# Patient Record
Sex: Male | Born: 1975 | Race: White | Hispanic: No | Marital: Married | Smoking: Current every day smoker
Health system: Southern US, Community
[De-identification: ages and names within clinical notes are randomized; demographics above are authoritative.]

## PROBLEM LIST (undated history)

## (undated) DIAGNOSIS — J9383 Other pneumothorax: Secondary | ICD-10-CM

## (undated) DIAGNOSIS — F419 Anxiety disorder, unspecified: Secondary | ICD-10-CM

## (undated) HISTORY — DX: Anxiety disorder, unspecified: F41.9

## (undated) HISTORY — PX: OTHER SURGICAL HISTORY: SHX169

---

## 1998-09-02 HISTORY — PX: OTHER SURGICAL HISTORY: SHX169

## 2003-03-18 ENCOUNTER — Emergency Department (HOSPITAL_COMMUNITY): Admission: EM | Admit: 2003-03-18 | Discharge: 2003-03-18 | Payer: Self-pay

## 2003-06-10 ENCOUNTER — Emergency Department (HOSPITAL_COMMUNITY): Admission: EM | Admit: 2003-06-10 | Discharge: 2003-06-10 | Payer: Self-pay | Admitting: Emergency Medicine

## 2004-03-11 ENCOUNTER — Emergency Department (HOSPITAL_COMMUNITY): Admission: EM | Admit: 2004-03-11 | Discharge: 2004-03-11 | Payer: Self-pay | Admitting: Emergency Medicine

## 2004-10-21 ENCOUNTER — Emergency Department (HOSPITAL_COMMUNITY): Admission: EM | Admit: 2004-10-21 | Discharge: 2004-10-21 | Payer: Self-pay | Admitting: Emergency Medicine

## 2005-03-25 ENCOUNTER — Emergency Department (HOSPITAL_COMMUNITY): Admission: EM | Admit: 2005-03-25 | Discharge: 2005-03-25 | Payer: Self-pay | Admitting: Emergency Medicine

## 2005-05-09 ENCOUNTER — Emergency Department (HOSPITAL_COMMUNITY): Admission: EM | Admit: 2005-05-09 | Discharge: 2005-05-09 | Payer: Self-pay | Admitting: Emergency Medicine

## 2005-06-03 ENCOUNTER — Emergency Department (HOSPITAL_COMMUNITY): Admission: EM | Admit: 2005-06-03 | Discharge: 2005-06-03 | Payer: Self-pay | Admitting: Emergency Medicine

## 2005-06-11 ENCOUNTER — Emergency Department (HOSPITAL_COMMUNITY): Admission: EM | Admit: 2005-06-11 | Discharge: 2005-06-11 | Payer: Self-pay | Admitting: Emergency Medicine

## 2005-09-09 ENCOUNTER — Emergency Department (HOSPITAL_COMMUNITY): Admission: EM | Admit: 2005-09-09 | Discharge: 2005-09-09 | Payer: Self-pay | Admitting: Emergency Medicine

## 2006-01-11 ENCOUNTER — Emergency Department (HOSPITAL_COMMUNITY): Admission: EM | Admit: 2006-01-11 | Discharge: 2006-01-11 | Payer: Self-pay | Admitting: Emergency Medicine

## 2006-05-26 ENCOUNTER — Emergency Department (HOSPITAL_COMMUNITY): Admission: EM | Admit: 2006-05-26 | Discharge: 2006-05-26 | Payer: Self-pay | Admitting: Emergency Medicine

## 2006-06-24 ENCOUNTER — Emergency Department (HOSPITAL_COMMUNITY): Admission: EM | Admit: 2006-06-24 | Discharge: 2006-06-24 | Payer: Self-pay | Admitting: Emergency Medicine

## 2006-08-11 ENCOUNTER — Emergency Department (HOSPITAL_COMMUNITY): Admission: EM | Admit: 2006-08-11 | Discharge: 2006-08-11 | Payer: Self-pay | Admitting: Emergency Medicine

## 2006-12-18 ENCOUNTER — Emergency Department (HOSPITAL_COMMUNITY): Admission: EM | Admit: 2006-12-18 | Discharge: 2006-12-18 | Payer: Self-pay | Admitting: Emergency Medicine

## 2008-04-03 ENCOUNTER — Emergency Department (HOSPITAL_COMMUNITY): Admission: EM | Admit: 2008-04-03 | Discharge: 2008-04-03 | Payer: Self-pay | Admitting: Emergency Medicine

## 2011-12-23 ENCOUNTER — Inpatient Hospital Stay (HOSPITAL_COMMUNITY)
Admission: EM | Admit: 2011-12-23 | Discharge: 2011-12-29 | DRG: 575 | Disposition: A | Discharge status: Home Health | Payer: Medicaid Other | Attending: Internal Medicine | Admitting: Internal Medicine

## 2011-12-23 ENCOUNTER — Emergency Department (HOSPITAL_COMMUNITY): Payer: Medicaid Other

## 2011-12-23 ENCOUNTER — Inpatient Hospital Stay (HOSPITAL_COMMUNITY): Payer: Medicaid Other | Admitting: *Deleted

## 2011-12-23 ENCOUNTER — Encounter (HOSPITAL_COMMUNITY): Payer: Self-pay | Admitting: *Deleted

## 2011-12-23 ENCOUNTER — Encounter (HOSPITAL_COMMUNITY)
Admission: EM | Disposition: A | Discharge status: Home Health | Payer: Self-pay | Source: Home / Self Care | Attending: Internal Medicine

## 2011-12-23 DIAGNOSIS — E860 Dehydration: Secondary | ICD-10-CM | POA: Diagnosis present

## 2011-12-23 DIAGNOSIS — D72829 Elevated white blood cell count, unspecified: Secondary | ICD-10-CM | POA: Diagnosis present

## 2011-12-23 DIAGNOSIS — B954 Other streptococcus as the cause of diseases classified elsewhere: Secondary | ICD-10-CM | POA: Diagnosis present

## 2011-12-23 DIAGNOSIS — F329 Major depressive disorder, single episode, unspecified: Secondary | ICD-10-CM | POA: Diagnosis present

## 2011-12-23 DIAGNOSIS — Z79899 Other long term (current) drug therapy: Secondary | ICD-10-CM

## 2011-12-23 DIAGNOSIS — F3289 Other specified depressive episodes: Secondary | ICD-10-CM | POA: Diagnosis present

## 2011-12-23 DIAGNOSIS — F17200 Nicotine dependence, unspecified, uncomplicated: Secondary | ICD-10-CM | POA: Diagnosis present

## 2011-12-23 DIAGNOSIS — L0390 Cellulitis, unspecified: Secondary | ICD-10-CM

## 2011-12-23 DIAGNOSIS — F32A Depression, unspecified: Secondary | ICD-10-CM

## 2011-12-23 DIAGNOSIS — L02519 Cutaneous abscess of unspecified hand: Principal | ICD-10-CM | POA: Diagnosis present

## 2011-12-23 DIAGNOSIS — E876 Hypokalemia: Secondary | ICD-10-CM

## 2011-12-23 DIAGNOSIS — L0291 Cutaneous abscess, unspecified: Secondary | ICD-10-CM

## 2011-12-23 HISTORY — PX: I&D EXTREMITY: SHX5045

## 2011-12-23 HISTORY — DX: Other pneumothorax: J93.83

## 2011-12-23 LAB — COMPREHENSIVE METABOLIC PANEL
ALT: 31 U/L (ref 0–53)
AST: 18 U/L (ref 0–37)
Albumin: 3 g/dL — ABNORMAL LOW (ref 3.5–5.2)
Alkaline Phosphatase: 104 U/L (ref 39–117)
Calcium: 9.3 mg/dL (ref 8.4–10.5)
Chloride: 93 mEq/L — ABNORMAL LOW (ref 96–112)
Creatinine, Ser: 0.76 mg/dL (ref 0.50–1.35)
GFR calc non Af Amer: >90 mL/min (ref >90)
Glucose, Bld: 108 mg/dL — ABNORMAL HIGH (ref 70–99)
Potassium: 2.9 mEq/L — ABNORMAL LOW (ref 3.5–5.1)
Sodium: 132 mEq/L — ABNORMAL LOW (ref 135–145)
Total Bilirubin: 0.4 mg/dL (ref 0.3–1.2)
Total Protein: 7 g/dL (ref 6.0–8.3)

## 2011-12-23 LAB — DIFFERENTIAL
Basophils Absolute: 0 10*3/uL (ref 0.0–0.1)
Basophils Relative: 0 % (ref 0–1)
Eosinophils Absolute: 0 10*3/uL (ref 0.0–0.7)
Eosinophils Relative: 0 % (ref 0–5)
Lymphocytes Relative: 8 % — ABNORMAL LOW (ref 12–46)
Lymphs Abs: 1.8 10*3/uL (ref 0.7–4.0)
Monocytes Absolute: 2 10*3/uL — ABNORMAL HIGH (ref 0.1–1.0)
Monocytes Relative: 9 % (ref 3–12)

## 2011-12-23 LAB — GRAM STAIN

## 2011-12-23 LAB — CBC
MCH: 31.3 pg (ref 26.0–34.0)
MCHC: 36.4 g/dL — ABNORMAL HIGH (ref 30.0–36.0)
MCV: 85.9 fL (ref 78.0–100.0)
Platelets: 211 10*3/uL (ref 150–400)
RBC: 4.67 MIL/uL (ref 4.22–5.81)
RDW: 14.2 % (ref 11.5–15.5)
WBC: 21.7 10*3/uL — ABNORMAL HIGH (ref 4.0–10.5)

## 2011-12-23 LAB — LACTIC ACID, PLASMA: Lactic Acid, Venous: 1.3 mmol/L (ref 0.5–2.2)

## 2011-12-23 SURGERY — IRRIGATION AND DEBRIDEMENT EXTREMITY
Anesthesia: General | Site: Hand | Laterality: Right | Wound class: Dirty or Infected

## 2011-12-23 MED ORDER — MIDAZOLAM HCL 5 MG/5ML IJ SOLN
INTRAMUSCULAR | Status: DC | PRN
  Administered 2011-12-23: 2 mg via INTRAVENOUS

## 2011-12-23 MED ORDER — BUPIVACAINE HCL (PF) 0.25 % IJ SOLN
INTRAMUSCULAR | Status: DC | PRN
  Administered 2011-12-23: 10 mL

## 2011-12-23 MED ORDER — ONDANSETRON HCL 4 MG/2ML IJ SOLN
INTRAMUSCULAR | Status: DC | PRN
  Administered 2011-12-23: 4 mg via INTRAVENOUS

## 2011-12-23 MED ORDER — HYDROMORPHONE HCL PF 1 MG/ML IJ SOLN
0.2500 mg | INTRAMUSCULAR | Status: DC | PRN
  Administered 2011-12-23 – 2011-12-24 (×5): 0.5 mg via INTRAVENOUS
  Filled 2011-12-23: qty 1

## 2011-12-23 MED ORDER — VANCOMYCIN HCL IN DEXTROSE 1-5 GM/200ML-% IV SOLN
1000.0000 mg | Freq: Once | INTRAVENOUS | Status: AC
  Administered 2011-12-23: 1000 mg via INTRAVENOUS
  Filled 2011-12-23: qty 200

## 2011-12-23 MED ORDER — FENTANYL CITRATE 0.05 MG/ML IJ SOLN
INTRAMUSCULAR | Status: DC | PRN
  Administered 2011-12-23: 100 ug via INTRAVENOUS
  Administered 2011-12-23: 150 ug via INTRAVENOUS
  Administered 2011-12-23: 100 ug via INTRAVENOUS

## 2011-12-23 MED ORDER — HYDROMORPHONE HCL PF 1 MG/ML IJ SOLN
1.0000 mg | Freq: Once | INTRAMUSCULAR | Status: AC
  Administered 2011-12-23: 1 mg via INTRAVENOUS
  Filled 2011-12-23: qty 1

## 2011-12-23 MED ORDER — LACTATED RINGERS IV SOLN
INTRAVENOUS | Status: DC | PRN
  Administered 2011-12-23: 22:00:00 via INTRAVENOUS

## 2011-12-23 MED ORDER — SODIUM CHLORIDE 0.9 % IR SOLN
Status: DC | PRN
  Administered 2011-12-23: 3000 mL

## 2011-12-23 MED ORDER — HYDROMORPHONE HCL PF 1 MG/ML IJ SOLN
INTRAMUSCULAR | Status: AC
  Administered 2011-12-23: 0.5 mg via INTRAVENOUS
  Filled 2011-12-23: qty 1

## 2011-12-23 MED ORDER — MORPHINE SULFATE 2 MG/ML IJ SOLN: 0.0500 mg/kg | INTRAMUSCULAR | Status: DC | PRN

## 2011-12-23 MED ORDER — PROPOFOL 10 MG/ML IV BOLUS
INTRAVENOUS | Status: DC | PRN
  Administered 2011-12-23: 200 mg via INTRAVENOUS

## 2011-12-23 MED ORDER — ONDANSETRON HCL 4 MG/2ML IJ SOLN
4.0000 mg | Freq: Once | INTRAMUSCULAR | Status: AC
  Administered 2011-12-23: 4 mg via INTRAVENOUS
  Filled 2011-12-23: qty 2

## 2011-12-23 MED ORDER — CLINDAMYCIN PHOSPHATE 600 MG/50ML IV SOLN
600.0000 mg | Freq: Once | INTRAVENOUS | Status: AC
  Administered 2011-12-23: 600 mg via INTRAVENOUS
  Filled 2011-12-23: qty 50

## 2011-12-23 MED ORDER — ONDANSETRON HCL 4 MG/2ML IJ SOLN: 4.0000 mg | Freq: Once | INTRAMUSCULAR | Status: AC | PRN

## 2011-12-23 MED ORDER — SUCCINYLCHOLINE CHLORIDE 20 MG/ML IJ SOLN
INTRAMUSCULAR | Status: DC | PRN
  Administered 2011-12-23: 120 mg via INTRAVENOUS

## 2011-12-23 SURGICAL SUPPLY — 49 items
BANDAGE CONFORM 2  STR LF (GAUZE/BANDAGES/DRESSINGS) IMPLANT
BANDAGE ELASTIC 3 VELCRO ST LF (GAUZE/BANDAGES/DRESSINGS) IMPLANT
BANDAGE ELASTIC 4 VELCRO ST LF (GAUZE/BANDAGES/DRESSINGS) ×2 IMPLANT
BANDAGE GAUZE ELAST BULKY 4 IN (GAUZE/BANDAGES/DRESSINGS) ×2 IMPLANT
BNDG CMPR 9X4 STRL LF SNTH (GAUZE/BANDAGES/DRESSINGS) ×1
BNDG ESMARK 4X9 LF (GAUZE/BANDAGES/DRESSINGS) ×2 IMPLANT
CLOTH BEACON ORANGE TIMEOUT ST (SAFETY) ×2 IMPLANT
CORDS BIPOLAR (ELECTRODE) ×2 IMPLANT
COVER SURGICAL LIGHT HANDLE (MISCELLANEOUS) ×2 IMPLANT
CUFF TOURNIQUET SINGLE 18IN (TOURNIQUET CUFF) ×2 IMPLANT
DECANTER SPIKE VIAL GLASS SM (MISCELLANEOUS) IMPLANT
DRAIN PENROSE 1/4X12 LTX STRL (WOUND CARE) IMPLANT
DRAPE OEC MINIVIEW 54X84 (DRAPES) IMPLANT
DRAPE SURG 17X23 STRL (DRAPES) ×2 IMPLANT
DRSG PAD ABDOMINAL 8X10 ST (GAUZE/BANDAGES/DRESSINGS) ×4 IMPLANT
DURAPREP 26ML APPLICATOR (WOUND CARE) ×2 IMPLANT
ELECT REM PT RETURN 9FT ADLT (ELECTROSURGICAL)
ELECTRODE REM PT RTRN 9FT ADLT (ELECTROSURGICAL) IMPLANT
GAUZE PACKING IODOFORM 1/4X5 (PACKING) ×2 IMPLANT
GAUZE XEROFORM 1X8 LF (GAUZE/BANDAGES/DRESSINGS) IMPLANT
GLOVE BIO SURGEON STRL SZ8.5 (GLOVE) ×2 IMPLANT
GOWN SRG XL XLNG 56XLVL 4 (GOWN DISPOSABLE) IMPLANT
GOWN STRL NON-REIN LRG LVL3 (GOWN DISPOSABLE) ×4 IMPLANT
GOWN STRL NON-REIN XL XLG LVL4 (GOWN DISPOSABLE)
HANDPIECE INTERPULSE COAX TIP (DISPOSABLE) ×2
KIT BASIN OR (CUSTOM PROCEDURE TRAY) ×2 IMPLANT
KIT ROOM TURNOVER OR (KITS) ×2 IMPLANT
MANIFOLD NEPTUNE II (INSTRUMENTS) ×2 IMPLANT
NEEDLE HYPO 25GX1X1/2 BEV (NEEDLE) ×2 IMPLANT
NEEDLE HYPO 25X1 1.5 SAFETY (NEEDLE) ×2 IMPLANT
NS IRRIG 1000ML POUR BTL (IV SOLUTION) IMPLANT
PACK ORTHO EXTREMITY (CUSTOM PROCEDURE TRAY) ×2 IMPLANT
PAD ARMBOARD 7.5X6 YLW CONV (MISCELLANEOUS) ×2 IMPLANT
PAD CAST 4YDX4 CTTN HI CHSV (CAST SUPPLIES) ×1 IMPLANT
PADDING CAST COTTON 4X4 STRL (CAST SUPPLIES) ×2
SET HNDPC FAN SPRY TIP SCT (DISPOSABLE) ×1 IMPLANT
SPONGE GAUZE 4X4 12PLY (GAUZE/BANDAGES/DRESSINGS) ×4 IMPLANT
SPONGE LAP 18X18 X RAY DECT (DISPOSABLE) IMPLANT
SUCTION FRAZIER TIP 10 FR DISP (SUCTIONS) IMPLANT
SUT VICRYL RAPIDE 4/0 PS 2 (SUTURE) ×2 IMPLANT
SYR 20CC LL (SYRINGE) IMPLANT
SYR CONTROL 10ML LL (SYRINGE) ×2 IMPLANT
TOWEL OR 17X24 6PK STRL BLUE (TOWEL DISPOSABLE) ×2 IMPLANT
TOWEL OR 17X26 10 PK STRL BLUE (TOWEL DISPOSABLE) ×2 IMPLANT
TUBE ANAEROBIC SPECIMEN COL (MISCELLANEOUS) ×2 IMPLANT
TUBE CONNECTING 12X1/4 (SUCTIONS) ×2 IMPLANT
UNDERPAD 30X30 INCONTINENT (UNDERPADS AND DIAPERS) ×2 IMPLANT
WATER STERILE IRR 1000ML POUR (IV SOLUTION) IMPLANT
YANKAUER SUCT BULB TIP NO VENT (SUCTIONS) ×2 IMPLANT

## 2011-12-23 NOTE — Anesthesia Postprocedure Evaluation (Signed)
  Anesthesia Post-op Note  Patient: Hector Parks  Procedure(s) Performed: Procedure(s) (LRB): IRRIGATION AND DEBRIDEMENT EXTREMITY (Right)  Patient Location: PACU  Anesthesia Type: General  Level of Consciousness: awake, alert  and oriented  Airway and Oxygen Therapy: Patient Spontanous Breathing and Patient connected to nasal cannula oxygen  Post-op Pain: mild  Post-op Assessment: Post-op Vital signs reviewed  Post-op Vital Signs: Reviewed  Complications: No apparent anesthesia complications

## 2011-12-23 NOTE — Op Note (Signed)
NAMEJOURDON, ZIMMERLE               ACCOUNT NO.:  [id number]  MEDICAL RECORD NO.:  [id number]  LOCATION:  [facility]                        FACILITY:  [facility]  PHYSICIAN:  Artist Pais. Jhordan Mckibben, M.D.DATE OF BIRTH:  1975/12/22  DATE OF PROCEDURE:  12/23/2011 DATE OF DISCHARGE:                              OPERATIVE REPORT   PREOPERATIVE DIAGNOSIS:  Right hand dorsal abscess.  POSTOPERATIVE DIAGNOSIS:  Right hand dorsal abscess.  PROCEDURE:  Incision and drainage, right hand.  SURGEON:  Artist Pais. Mina Marble, MD  ASSISTANT:  None.  ANESTHESIA:  General.  COMPLICATIONS:  None.  DRAINS:  None.  WOUND:  Packed open.  Cultures x2 sent including stat Gram-stain.  DESCRIPTION OF PROCEDURE:  The patient was taken to the operating suite after induction of general anesthesia.  Right upper extremity was prepped and draped in usual sterile fashion.  An Esmarch was used to exsanguinate the limb.  The tourniquet was inflated to 265 mmHg.  At this point in time, an incision was made in the dorsal aspect of the right hand just proximal to the metacarpophalangeal joint going proximally for 3-4 cm.  Skin was incised.  The fascia overlying the extensor tendons to the index, long and ring fingers was incised, cloudy fluid and a small amount of purulence was encountered.  It was cultured for aerobic, anaerobic and stat Gram-stain.  The wound was then extended proximally and distally another centimeter until edema fluid was encountered.  A hemostat was used to undermine skin flaps until normal tissue was encountered.  This was thoroughly irrigated with 3 liters of normal saline.  A second incision was made volarly proximal to the wrist crease by 3 cm.  Incision was longitudinal of the palmaris longus tendon.  Skin was incised.  Edema fluid was encountered.  This wound was thoroughly irrigated and packed open with 0.25-inch Iodoform gauze as well as the dorsal wound.  The patient was then placed in a  sterile dressing of 4x4s, fluffs, and a compression Coban wrap with an Ace bandage.  The patient tolerated the procedure well, went to the recovery room in stable fashion.     Artist Pais Mina Marble, M.D.     MAW/MEDQ  D:  12/23/2011  T:  12/23/2011  Job:  [id number]

## 2011-12-23 NOTE — Consult Note (Signed)
Reason for Consult:right hand swelling Referring Physician: Rancour  BRAVLIO LUCA is an 36 y.o. male.  HPI: with 4 day h/o worsening right hand pain and swelling despite PO ABX and rest  Past Medical History  Diagnosis Date  . Spontaneous pneumothorax     No past surgical history on file.  History reviewed. No pertinent family history.  Social History:  reports that he has been smoking Cigarettes.  He has been smoking about 1 pack per day. He does not have any smokeless tobacco history on file. He reports that he does not drink alcohol. His drug history not on file.  Allergies: No Known Allergies  Medications: I have reviewed the patient's current medications.  Results for orders placed during the hospital encounter of 12/23/11 (from the past 48 hour(s))  CBC     Status: Abnormal   Collection Time   12/23/11  7:19 PM      Component Value Range Comment   WBC 21.7 (*) 4.0 - 10.5 (K/uL)    RBC 4.67  4.22 - 5.81 (MIL/uL)    Hemoglobin 14.6  13.0 - 17.0 (g/dL)    HCT 45.4  09.8 - 11.9 (%)    MCV 85.9  78.0 - 100.0 (fL)    MCH 31.3  26.0 - 34.0 (pg)    MCHC 36.4 (*) 30.0 - 36.0 (g/dL)    RDW 14.7  82.9 - 56.2 (%)    Platelets 211  150 - 400 (K/uL)   DIFFERENTIAL     Status: Abnormal   Collection Time   12/23/11  7:19 PM      Component Value Range Comment   Neutrophils Relative 82 (*) 43 - 77 (%)    Neutro Abs 17.8 (*) 1.7 - 7.7 (K/uL)    Lymphocytes Relative 8 (*) 12 - 46 (%)    Lymphs Abs 1.8  0.7 - 4.0 (K/uL)    Monocytes Relative 9  3 - 12 (%)    Monocytes Absolute 2.0 (*) 0.1 - 1.0 (K/uL)    Eosinophils Relative 0  0 - 5 (%)    Eosinophils Absolute 0.0  0.0 - 0.7 (K/uL)    Basophils Relative 0  0 - 1 (%)    Basophils Absolute 0.0  0.0 - 0.1 (K/uL)   COMPREHENSIVE METABOLIC PANEL     Status: Abnormal   Collection Time   12/23/11  7:19 PM      Component Value Range Comment   Sodium 132 (*) 135 - 145 (mEq/L)    Potassium 2.9 (*) 3.5 - 5.1 (mEq/L)    Chloride 93 (*)  96 - 112 (mEq/L)    CO2 26  19 - 32 (mEq/L)    Glucose, Bld 108 (*) 70 - 99 (mg/dL)    BUN 10  6 - 23 (mg/dL)    Creatinine, Ser 1.30  0.50 - 1.35 (mg/dL)    Calcium 9.3  8.4 - 10.5 (mg/dL)    Total Protein 7.0  6.0 - 8.3 (g/dL)    Albumin 3.0 (*) 3.5 - 5.2 (g/dL)    AST 18  0 - 37 (U/L)    ALT 31  0 - 53 (U/L)    Alkaline Phosphatase 104  39 - 117 (U/L)    Total Bilirubin 0.4  0.3 - 1.2 (mg/dL)    GFR calc non Af Amer >90  >90 (mL/min)    GFR calc Af Amer >90  >90 (mL/min)   LACTIC ACID, PLASMA     Status: Normal  Collection Time   12/23/11  7:37 PM      Component Value Range Comment   Lactic Acid, Venous 1.3  0.5 - 2.2 (mmol/L)     Dg Forearm Right  12/23/2011  *RADIOLOGY REPORT*  Clinical Data: Pain, redness and swelling of the right hand and forearm for the past 4 days.  RIGHT FOREARM - 2 VIEW  Comparison: No priors.  Findings: AP and lateral radiographs of the right forearm demonstrate no acute fracture or retained radiopaque foreign body. Overlying soft tissues appear mildly swollen.  IMPRESSION: 1.  No acute radiographic abnormality of the right forearm. Specifically, no retained radiopaque foreign body identified.  Original Report Authenticated By: Florencia Reasons, M.D.   Dg Hand Complete Right  12/23/2011  *RADIOLOGY REPORT*  Clinical Data: Pain and swelling in the right hand and forearm for the past 4 days, getting worse.  RIGHT HAND - COMPLETE 3+ VIEW  Comparison: No priors.  Findings: Multiple views of the right hand demonstrate no acute fracture, subluxation, dislocation, retained radiopaque foreign body or joint abnormality.  There is profound soft tissue swelling in the right hand, particularly overlying the region of the metacarpals.  IMPRESSION: 1.  Profound soft tissue swelling of the right hand overlying the metacarpals.  No unexpected retained radiopaque foreign body or underlying bony abnormality.  Original Report Authenticated By: Florencia Reasons, M.D.     Review of Systems  Constitutional: Positive for fever.  All other systems reviewed and are negative.   Blood pressure 137/87, pulse 110, temperature 99.2 F (37.3 C), temperature source Oral, resp. rate 18, SpO2 97.00%. Physical Exam  Constitutional: He is oriented to person, place, and time. He appears well-developed and well-nourished.  HENT:  Head: Normocephalic and atraumatic.  Cardiovascular: Normal rate.   Respiratory: Effort normal.  Musculoskeletal:       Hands: Neurological: He is alert and oriented to person, place, and time.  Skin: Skin is warm.  Psychiatric: He has a normal mood and affect. His speech is normal and behavior is normal. Thought content normal.    Assessment/Plan: 36 y/o male with right forearm cellulitis and probable right hand abscess  Plan admit to medicine with I and D tonight  Malik Paar,Claudius A 12/23/2011, 8:58 PM

## 2011-12-23 NOTE — ED Provider Notes (Signed)
History     CSN: [id number]  Arrival date & time 12/23/11  1843   First MD Initiated Contact with Patient 12/23/11 1854      Chief Complaint  Patient presents with  . Cellulitis    (Consider location/radiation/quality/duration/timing/severity/associated sxs/prior treatment) HPI Comments: Patient presents with progressively worsening pain, swelling and redness to his right upper chin but he for the past 4 days. He states he woke up on Saturday morning and noticed redness to the back of his right hand and went to urgent care and started on Bactrim. Redness has progressed up his hand, arm and upper arm over the past few days. He denies any fevers, vomiting but has felt nauseated at home. No chest pain or shortness of breath. 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 He denies any IV drug abuse. Denies any insect bites or stings.  The history is provided by the patient.    Past Medical History  Diagnosis Date  . Spontaneous pneumothorax     History reviewed. No pertinent past surgical history.  History reviewed. No pertinent family history.  History  Substance Use Topics  . Smoking status: Current Everyday Smoker -- 1.0 packs/day    Types: Cigarettes  . Smokeless tobacco: Not on file  . Alcohol Use: No      Review of Systems  Constitutional: Negative for fever, activity change and appetite change.  HENT: Negative for congestion and rhinorrhea.   Eyes: Negative for visual disturbance.  Respiratory: Negative for cough, chest tightness and shortness of breath.   Cardiovascular: Negative for chest pain.  Gastrointestinal: Negative for nausea, vomiting and abdominal pain.  Genitourinary: Negative for dysuria and hematuria.  Musculoskeletal: Positive for myalgias and arthralgias. Negative for back pain.  Skin: Positive for wound. Negative for rash.  Neurological: Negative for dizziness, weakness and headaches.     Allergies  Review of patient's allergies indicates no known allergies.  Home Medications   No current outpatient prescriptions on file.  BP 138/67  Pulse 86  Temp(Src) 99.4 F (37.4 C) (Oral)  Resp 15  SpO2 95%  Physical Exam  Constitutional: He is oriented to person, place, and time. He appears well-developed and well-nourished. No distress.  HENT:  Head: Normocephalic and atraumatic.  Mouth/Throat: Oropharynx is clear and moist. No oropharyngeal exudate.  Eyes: Conjunctivae are normal. Pupils are equal, round, and reactive to light.  Neck: Normal range of motion.  Cardiovascular: Normal rate, regular rhythm and normal heart sounds.   Pulmonary/Chest: Effort normal and breath sounds normal. No respiratory distress.  Abdominal: Soft. There is no tenderness. There is no rebound and no guarding.  Musculoskeletal: He exhibits edema and tenderness.       There is impressive edema erythema and redness to the dorsal right hand extending up the forearm to the axilla. Posterior pulse, reduced range of motion secondary to pain and swelling. Few scratches to the dorsal hand and wrist  Neurological: He is alert and oriented to person, place, and time. No cranial nerve deficit.  Skin: Skin is warm.    ED Course  Procedures (including critical care time)  Labs Reviewed  CBC - Abnormal; Notable for the following:    WBC 21.7 (*)    MCHC 36.4 (*)    All other components within normal limits  DIFFERENTIAL - Abnormal; Notable for the following:    Neutrophils Relative 82 (*)    Neutro  Abs 17.8 (*)    Lymphocytes Relative 8 (*)    Monocytes Absolute 2.0 (*)    All other components within normal limits  COMPREHENSIVE METABOLIC PANEL - Abnormal; Notable for the following:    Sodium 132 (*)    Potassium 2.9 (*)    Chloride 93 (*)    Glucose, Bld 108 (*)    Albumin 3.0 (*)    All other components within normal limits  LACTIC ACID, PLASMA  GRAM STAIN  CULTURE, BLOOD (ROUTINE X 2)    CULTURE, BLOOD (ROUTINE X 2)  CULTURE, ROUTINE-ABSCESS  ANAEROBIC CULTURE  COMPREHENSIVE METABOLIC PANEL  CBC  CBC  DIFFERENTIAL   Dg Forearm Right  12/23/2011  *RADIOLOGY REPORT*  Clinical Data: Pain, redness and swelling of the right hand and forearm for the past 4 days.  RIGHT FOREARM - 2 VIEW  Comparison: No priors.  Findings: AP and lateral radiographs of the right forearm demonstrate no acute fracture or retained radiopaque foreign body. Overlying soft tissues appear mildly swollen.  IMPRESSION: 1.  No acute radiographic abnormality of the right forearm. Specifically, no retained radiopaque foreign body identified.  Original Report Authenticated By: Florencia Reasons, M.D.   Dg Hand Complete Right  12/23/2011  *RADIOLOGY REPORT*  Clinical Data: Pain and swelling in the right hand and forearm for the past 4 days, getting worse.  RIGHT HAND - COMPLETE 3+ VIEW  Comparison: No priors.  Findings: Multiple views of the right hand demonstrate no acute fracture, subluxation, dislocation, retained radiopaque foreign body or joint abnormality.  There is profound soft tissue swelling in the right hand, particularly overlying the region of the metacarpals.  IMPRESSION: 1.  Profound soft tissue swelling of the right hand overlying the metacarpals.  No unexpected retained radiopaque foreign body or underlying bony abnormality.  Original Report Authenticated By: Florencia Reasons, M.D.     1. Abscess and cellulitis       MDM  Progressively worsening over several cellulitis, failure of outpatient treatment. Neurovascularly intact.  Compartments soft.  Leukocytosis noted, Xrays negative for FB. Vanco, clinda begun, cultures obtained. D/w Dr. Mina Marble who wil take patient to OR for I=D.  [facility] to admit.       Glynn Octave, MD 12/24/11 [phone]

## 2011-12-23 NOTE — Anesthesia Preprocedure Evaluation (Addendum)
Anesthesia Evaluation  Patient identified by MRN, date of birth, ID band Patient awake    Reviewed: Allergy & Precautions, H&P , NPO status , Patient's Chart, lab work & pertinent test results, reviewed documented beta blocker date and time   Airway Mallampati: II TM Distance: >3 FB Neck ROM: Full    Dental  (+) Teeth Intact and Dental Advisory Given   Pulmonary Current Smoker,  breath sounds clear to auscultation        Cardiovascular Rhythm:Regular Rate:Normal     Neuro/Psych    GI/Hepatic Hx of vomiting for 3 days   Endo/Other    Renal/GU      Musculoskeletal   Abdominal   Peds  Hematology   Anesthesia Other Findings   Reproductive/Obstetrics                          Anesthesia Physical Anesthesia Plan  ASA: II and Emergent  Anesthesia Plan: General   Post-op Pain Management:    Induction: Intravenous  Airway Management Planned: Oral ETT  Additional Equipment:   Intra-op Plan:   Post-operative Plan: Extubation in OR  Informed Consent: I have reviewed the patients History and Physical, chart, labs and discussed the procedure including the risks, benefits and alternatives for the proposed anesthesia with the patient or authorized representative who has indicated his/her understanding and acceptance.   Dental advisory given  Plan Discussed with: CRNA, Surgeon and Anesthesiologist  Anesthesia Plan Comments:        Anesthesia Quick Evaluation

## 2011-12-23 NOTE — Op Note (Signed)
See dictated note [phone]

## 2011-12-23 NOTE — Brief Op Note (Signed)
12/23/2011  10:14 PM  PATIENT:  Marijo Conception  36 y.o. male  PRE-OPERATIVE DIAGNOSIS:  Right Hand Abscess  POST-OPERATIVE DIAGNOSIS:  Right Hand Abscess  PROCEDURE:  Procedure(s) (LRB): IRRIGATION AND DEBRIDEMENT EXTREMITY (Right)  SURGEON:  Surgeon(s) and Role:    * Marlowe Shores, MD - Primary  PHYSICIAN ASSISTANT:   ASSISTANTS: none   ANESTHESIA:   general  EBL:     BLOOD ADMINISTERED:none  DRAINS: none   LOCAL MEDICATIONS USED:  MARCAINE   9cc  SPECIMEN:  No Specimen  DISPOSITION OF SPECIMEN:  N/A  COUNTS:  YES  TOURNIQUET:   Total Tourniquet Time Documented: Upper Arm (Right) - 24 minutes  DICTATION: .Other Dictation: Dictation Number [id number]  PLAN OF CARE: Admit to inpatient   PATIENT DISPOSITION:  PACU - hemodynamically stable.   Delay start of Pharmacological VTE agent (>24hrs) due to surgical blood loss or risk of bleeding: yes

## 2011-12-23 NOTE — ED Notes (Signed)
To ED for eval of right hand swelling and redness. Pt has redness to upper arm. Pt was seen at [facility] Saturday morning and given oral abx, but swelling and redness became worse.

## 2011-12-23 NOTE — Transfer of Care (Signed)
Immediate Anesthesia Transfer of Care Note  Patient: Hector Parks  Procedure(s) Performed: Procedure(s) (LRB): IRRIGATION AND DEBRIDEMENT EXTREMITY (Right)  Patient Location: PACU  Anesthesia Type: General  Level of Consciousness: awake, alert  and oriented  Airway & Oxygen Therapy: Patient Spontanous Breathing  Post-op Assessment: Report given to PACU RN and Post -op Vital signs reviewed and stable  Post vital signs: Reviewed and stable  Complications: No apparent anesthesia complications

## 2011-12-23 NOTE — ED Notes (Signed)
[id number] Ready

## 2011-12-23 NOTE — Preoperative (Signed)
Beta Blockers   Reason not to administer Beta Blockers:Not Applicable 

## 2011-12-23 NOTE — ED Notes (Signed)
Patient states with redness and swelling in right hand . Patient states he was seen at [facility] on Saturday and was given antibiotics and now swelling is worse and more swollen. Patient states he had a few small nicks to the skin on his right hand. Patient states he has had nausea and denies vomiting. Patient's right hand is swollen and blisters on top of is hand with redness radiating up his arm. EDP at bedside.

## 2011-12-24 ENCOUNTER — Encounter (HOSPITAL_COMMUNITY): Payer: Self-pay | Admitting: Orthopedic Surgery

## 2011-12-24 ENCOUNTER — Other Ambulatory Visit: Payer: Self-pay | Admitting: Orthopedic Surgery

## 2011-12-24 LAB — COMPREHENSIVE METABOLIC PANEL
AST: 18 U/L (ref 0–37)
CO2: 26 mEq/L (ref 19–32)
Calcium: 8.5 mg/dL (ref 8.4–10.5)
Creatinine, Ser: 0.67 mg/dL (ref 0.50–1.35)
GFR calc Af Amer: >90 mL/min (ref >90)
GFR calc non Af Amer: >90 mL/min (ref >90)
Glucose, Bld: 94 mg/dL (ref 70–99)
Sodium: 132 mEq/L — ABNORMAL LOW (ref 135–145)
Total Protein: 6.1 g/dL (ref 6.0–8.3)

## 2011-12-24 LAB — CBC
HCT: 36.3 % — ABNORMAL LOW (ref 39.0–52.0)
HCT: 35.8 % — ABNORMAL LOW (ref 39.0–52.0)
Hemoglobin: 13.1 g/dL (ref 13.0–17.0)
Hemoglobin: 12.6 g/dL — ABNORMAL LOW (ref 13.0–17.0)
MCH: 31.1 pg (ref 26.0–34.0)
MCHC: 36.1 g/dL — ABNORMAL HIGH (ref 30.0–36.0)
MCV: 86.2 fL (ref 78.0–100.0)
Platelets: 211 10*3/uL (ref 150–400)
RBC: 4.21 MIL/uL — ABNORMAL LOW (ref 4.22–5.81)
RBC: 4.16 MIL/uL — ABNORMAL LOW (ref 4.22–5.81)
RDW: 14.3 % (ref 11.5–15.5)
WBC: 19.2 10*3/uL — ABNORMAL HIGH (ref 4.0–10.5)

## 2011-12-24 LAB — DIFFERENTIAL
Basophils Absolute: 0 10*3/uL (ref 0.0–0.1)
Basophils Relative: 0 % (ref 0–1)
Eosinophils Absolute: 0.1 10*3/uL (ref 0.0–0.7)
Eosinophils Relative: 0 % (ref 0–5)
Lymphocytes Relative: 13 % (ref 12–46)
Lymphs Abs: 2.5 10*3/uL (ref 0.7–4.0)
Monocytes Absolute: 1.2 10*3/uL — ABNORMAL HIGH (ref 0.1–1.0)
Monocytes Relative: 6 % (ref 3–12)
Neutro Abs: 15.4 10*3/uL — ABNORMAL HIGH (ref 1.7–7.7)
Neutrophils Relative %: 80 % — ABNORMAL HIGH (ref 43–77)

## 2011-12-24 MED ORDER — NALOXONE HCL 0.4 MG/ML IJ SOLN: 0.4000 mg | INTRAMUSCULAR | Status: DC | PRN

## 2011-12-24 MED ORDER — ACETAMINOPHEN 325 MG PO TABS: 650.0000 mg | ORAL_TABLET | Freq: Four times a day (QID) | ORAL | Status: DC | PRN

## 2011-12-24 MED ORDER — OXYCODONE HCL 5 MG PO TABS
5.0000 mg | ORAL_TABLET | ORAL | Status: DC | PRN
  Administered 2011-12-24 – 2011-12-29 (×17): 5 mg via ORAL
  Filled 2011-12-24 (×17): qty 1

## 2011-12-24 MED ORDER — HYDROMORPHONE 0.3 MG/ML IV SOLN
INTRAVENOUS | Status: DC
  Administered 2011-12-24 (×2): 4.79 mg via INTRAVENOUS
  Administered 2011-12-24: 10:00:00 via INTRAVENOUS
  Administered 2011-12-24: 0.5 mg via INTRAVENOUS
  Administered 2011-12-24 (×2): via INTRAVENOUS
  Administered 2011-12-24: 0.3 mg via INTRAVENOUS
  Administered 2011-12-25: 18:00:00 via INTRAVENOUS
  Administered 2011-12-25: 2.4 mL via INTRAVENOUS
  Administered 2011-12-25: 1.4 mg via INTRAVENOUS
  Administered 2011-12-25: 3.6 mg via INTRAVENOUS
  Administered 2011-12-25: 7.7 mg via INTRAVENOUS
  Administered 2011-12-25 – 2011-12-26 (×3): via INTRAVENOUS
  Administered 2011-12-26: 0.6 mg via INTRAVENOUS
  Administered 2011-12-26: 2.333 mg via INTRAVENOUS
  Administered 2011-12-26: 4.6 mg via INTRAVENOUS
  Administered 2011-12-26: 3.9 mg via INTRAVENOUS
  Administered 2011-12-26: 22:00:00 via INTRAVENOUS
  Administered 2011-12-26: 2.7 mg via INTRAVENOUS
  Administered 2011-12-27: 3.3 mg via INTRAVENOUS
  Administered 2011-12-27: 3.6 mg via INTRAVENOUS
  Filled 2011-12-24 (×7): qty 25

## 2011-12-24 MED ORDER — ENOXAPARIN SODIUM 30 MG/0.3ML ~~LOC~~ SOLN
30.0000 mg | Freq: Two times a day (BID) | SUBCUTANEOUS | Status: DC
  Administered 2011-12-24 (×2): 30 mg via SUBCUTANEOUS
  Filled 2011-12-24 (×4): qty 0.3

## 2011-12-24 MED ORDER — SODIUM CHLORIDE 0.9 % IJ SOLN: 9.0000 mL | INTRAMUSCULAR | Status: DC | PRN

## 2011-12-24 MED ORDER — DIPHENHYDRAMINE HCL 12.5 MG/5ML PO ELIX
12.5000 mg | ORAL_SOLUTION | Freq: Four times a day (QID) | ORAL | Status: DC | PRN
  Filled 2011-12-24: qty 5

## 2011-12-24 MED ORDER — ESCITALOPRAM OXALATE 20 MG PO TABS
20.0000 mg | ORAL_TABLET | Freq: Every day | ORAL | Status: DC
  Administered 2011-12-24 – 2011-12-29 (×5): 20 mg via ORAL
  Filled 2011-12-24 (×6): qty 1

## 2011-12-24 MED ORDER — ACETAMINOPHEN 650 MG RE SUPP: 650.0000 mg | Freq: Four times a day (QID) | RECTAL | Status: DC | PRN

## 2011-12-24 MED ORDER — HYDROMORPHONE HCL PF 1 MG/ML IJ SOLN
1.0000 mg | INTRAMUSCULAR | Status: DC | PRN
  Administered 2011-12-24: 1 mg via INTRAVENOUS
  Filled 2011-12-24: qty 1

## 2011-12-24 MED ORDER — ENOXAPARIN SODIUM 30 MG/0.3ML ~~LOC~~ SOLN: 30.0000 mg | Freq: Two times a day (BID) | SUBCUTANEOUS | Status: DC

## 2011-12-24 MED ORDER — HYDROMORPHONE HCL PF 1 MG/ML IJ SOLN
2.0000 mg | INTRAMUSCULAR | Status: DC | PRN
  Administered 2011-12-24: 2 mg via INTRAVENOUS
  Filled 2011-12-24: qty 2

## 2011-12-24 MED ORDER — SODIUM CHLORIDE 0.9 % IV SOLN
1.5000 g | Freq: Four times a day (QID) | INTRAVENOUS | Status: DC
  Administered 2011-12-24 – 2011-12-25 (×3): 1.5 g via INTRAVENOUS
  Filled 2011-12-24 (×8): qty 1.5

## 2011-12-24 MED ORDER — HYDROMORPHONE 0.3 MG/ML IV SOLN
INTRAVENOUS | Status: DC
Start: 2011-12-24 — End: 2011-12-24
  Administered 2011-12-24: 05:00:00 via INTRAVENOUS
  Administered 2011-12-24: 3.59 mg via INTRAVENOUS
  Filled 2011-12-24 (×2): qty 25

## 2011-12-24 MED ORDER — POTASSIUM CHLORIDE IN NACL 40-0.9 MEQ/L-% IV SOLN
INTRAVENOUS | Status: DC
  Administered 2011-12-24 – 2011-12-25 (×4): via INTRAVENOUS
  Filled 2011-12-24 (×6): qty 1000

## 2011-12-24 MED ORDER — DIPHENHYDRAMINE HCL 50 MG/ML IJ SOLN
12.5000 mg | Freq: Four times a day (QID) | INTRAMUSCULAR | Status: DC | PRN
  Administered 2011-12-25: 12.5 mg via INTRAVENOUS

## 2011-12-24 MED ORDER — ONDANSETRON HCL 4 MG/2ML IJ SOLN: 4.0000 mg | Freq: Four times a day (QID) | INTRAMUSCULAR | Status: DC | PRN

## 2011-12-24 MED ORDER — VANCOMYCIN HCL 1000 MG IV SOLR
1500.0000 mg | Freq: Two times a day (BID) | INTRAVENOUS | Status: DC
  Administered 2011-12-24 – 2011-12-25 (×2): 1500 mg via INTRAVENOUS
  Filled 2011-12-24 (×3): qty 1500

## 2011-12-24 NOTE — H&P (Signed)
Hector Parks is an 36 y.o. male.   Chief Complaint: Hand swelling HPI: 36 YO with no significant PMHX here with right hand swelling for 4 days. He woke up with pain, swelling, initial redness of his hand. It was painful at 8/10. He went to the urgent care center where he was given Septra and sent home. The pain and swelling persisted so he came to the ER. Denies any chills, mild fever, no other sites involved. Patient had some Nausea but no vomiting, no Diarrhea. Has no insect bite, trauma or any other injury. Has low potassium in the ER.  Past Medical History  Diagnosis Date  . Spontaneous pneumothorax     History reviewed. No pertinent past surgical history.  History reviewed. No pertinent family history. Social History:  reports that he has been smoking Cigarettes.  He has been smoking about 1 pack per day. He does not have any smokeless tobacco history on file. He reports that he does not drink alcohol. His drug history not on file.  Allergies: No Known Allergies  Medications Prior to Admission  Medication Sig Dispense Refill  . escitalopram (LEXAPRO) 20 MG tablet Take 20 mg by mouth daily.      Marland Kitchen sulfamethoxazole-trimethoprim (BACTRIM DS) 800-160 MG per tablet Take 1 tablet by mouth 2 (two) times daily.        Results for orders placed during the hospital encounter of 12/23/11 (from the past 48 hour(s))  CBC     Status: Abnormal   Collection Time   12/23/11  7:19 PM      Component Value Range Comment   WBC 21.7 (*) 4.0 - 10.5 (K/uL)    RBC 4.67  4.22 - 5.81 (MIL/uL)    Hemoglobin 14.6  13.0 - 17.0 (g/dL)    HCT 45.4  09.8 - 11.9 (%)    MCV 85.9  78.0 - 100.0 (fL)    MCH 31.3  26.0 - 34.0 (pg)    MCHC 36.4 (*) 30.0 - 36.0 (g/dL)    RDW 14.7  82.9 - 56.2 (%)    Platelets 211  150 - 400 (K/uL)   DIFFERENTIAL     Status: Abnormal   Collection Time   12/23/11  7:19 PM      Component Value Range Comment   Neutrophils Relative 82 (*) 43 - 77 (%)    Neutro Abs 17.8 (*) 1.7 -  7.7 (K/uL)    Lymphocytes Relative 8 (*) 12 - 46 (%)    Lymphs Abs 1.8  0.7 - 4.0 (K/uL)    Monocytes Relative 9  3 - 12 (%)    Monocytes Absolute 2.0 (*) 0.1 - 1.0 (K/uL)    Eosinophils Relative 0  0 - 5 (%)    Eosinophils Absolute 0.0  0.0 - 0.7 (K/uL)    Basophils Relative 0  0 - 1 (%)    Basophils Absolute 0.0  0.0 - 0.1 (K/uL)   COMPREHENSIVE METABOLIC PANEL     Status: Abnormal   Collection Time   12/23/11  7:19 PM      Component Value Range Comment   Sodium 132 (*) 135 - 145 (mEq/L)    Potassium 2.9 (*) 3.5 - 5.1 (mEq/L)    Chloride 93 (*) 96 - 112 (mEq/L)    CO2 26  19 - 32 (mEq/L)    Glucose, Bld 108 (*) 70 - 99 (mg/dL)    BUN 10  6 - 23 (mg/dL)    Creatinine, Ser 1.30  0.50 - 1.35 (mg/dL)    Calcium 9.3  8.4 - 10.5 (mg/dL)    Total Protein 7.0  6.0 - 8.3 (g/dL)    Albumin 3.0 (*) 3.5 - 5.2 (g/dL)    AST 18  0 - 37 (U/L)    ALT 31  0 - 53 (U/L)    Alkaline Phosphatase 104  39 - 117 (U/L)    Total Bilirubin 0.4  0.3 - 1.2 (mg/dL)    GFR calc non Af Amer >90  >90 (mL/min)    GFR calc Af Amer >90  >90 (mL/min)   LACTIC ACID, PLASMA     Status: Normal   Collection Time   12/23/11  7:37 PM      Component Value Range Comment   Lactic Acid, Venous 1.3  0.5 - 2.2 (mmol/L)   GRAM STAIN     Status: Normal   Collection Time   12/23/11  9:56 PM      Component Value Range Comment   Specimen Description ABSCESS HAND RIGHT      Special Requests PT ON BACTRUM,CLEOCIN,VANC      Gram Stain        Value: RARE WBC PRESENT,BOTH PMN AND MONONUCLEAR     NO ORGANISMS SEEN   Report Status 12/23/2011 FINAL     CBC     Status: Abnormal   Collection Time   12/24/11  1:13 AM      Component Value Range Comment   WBC 19.2 (*) 4.0 - 10.5 (K/uL)    RBC 4.21 (*) 4.22 - 5.81 (MIL/uL)    Hemoglobin 13.1  13.0 - 17.0 (g/dL)    HCT 16.1 (*) 09.6 - 52.0 (%)    MCV 86.2  78.0 - 100.0 (fL)    MCH 31.1  26.0 - 34.0 (pg)    MCHC 36.1 (*) 30.0 - 36.0 (g/dL)    RDW 04.5  40.9 - 81.1 (%)    Platelets  211  150 - 400 (K/uL)   DIFFERENTIAL     Status: Abnormal   Collection Time   12/24/11  1:13 AM      Component Value Range Comment   Neutrophils Relative 80 (*) 43 - 77 (%)    Neutro Abs 15.4 (*) 1.7 - 7.7 (K/uL)    Lymphocytes Relative 13  12 - 46 (%)    Lymphs Abs 2.5  0.7 - 4.0 (K/uL)    Monocytes Relative 6  3 - 12 (%)    Monocytes Absolute 1.2 (*) 0.1 - 1.0 (K/uL)    Eosinophils Relative 0  0 - 5 (%)    Eosinophils Absolute 0.1  0.0 - 0.7 (K/uL)    Basophils Relative 0  0 - 1 (%)    Basophils Absolute 0.0  0.0 - 0.1 (K/uL)    Dg Forearm Right  12/23/2011  *RADIOLOGY REPORT*  Clinical Data: Pain, redness and swelling of the right hand and forearm for the past 4 days.  RIGHT FOREARM - 2 VIEW  Comparison: No priors.  Findings: AP and lateral radiographs of the right forearm demonstrate no acute fracture or retained radiopaque foreign body. Overlying soft tissues appear mildly swollen.  IMPRESSION: 1.  No acute radiographic abnormality of the right forearm. Specifically, no retained radiopaque foreign body identified.  Original Report Authenticated By: Florencia Reasons, M.D.   Dg Hand Complete Right  12/23/2011  *RADIOLOGY REPORT*  Clinical Data: Pain and swelling in the right hand and forearm for the past 4 days, getting worse.  RIGHT HAND - COMPLETE 3+ VIEW  Comparison: No priors.  Findings: Multiple views of the right hand demonstrate no acute fracture, subluxation, dislocation, retained radiopaque foreign body or joint abnormality.  There is profound soft tissue swelling in the right hand, particularly overlying the region of the metacarpals.  IMPRESSION: 1.  Profound soft tissue swelling of the right hand overlying the metacarpals.  No unexpected retained radiopaque foreign body or underlying bony abnormality.  Original Report Authenticated By: Florencia Reasons, M.D.    Review of Systems  Constitutional: Positive for fever. Negative for chills.  HENT: Negative.   Eyes: Negative.     Respiratory: Negative.   Cardiovascular: Negative.   Gastrointestinal: Negative.   Genitourinary: Negative.   Musculoskeletal:       Right hand swelling  Skin: Positive for rash.       Small scratches from work  Neurological: Negative.   Endo/Heme/Allergies: Negative.   Psychiatric/Behavioral: Negative.     Blood pressure 138/67, pulse 86, temperature 99.4 F (37.4 C), temperature source Oral, resp. rate 15, SpO2 95.00%. Physical Exam  Constitutional: He is oriented to person, place, and time. He appears well-developed and well-nourished.  HENT:  Head: Normocephalic and atraumatic.  Right Ear: External ear normal.  Left Ear: External ear normal.  Nose: Nose normal.  Mouth/Throat: Oropharynx is clear and moist.  Eyes: Conjunctivae and EOM are normal. Pupils are equal, round, and reactive to light.  Neck: Normal range of motion. Neck supple.  Cardiovascular: Normal rate, regular rhythm, normal heart sounds and intact distal pulses.   Respiratory: Effort normal and breath sounds normal.  GI: Soft. Bowel sounds are normal.  Musculoskeletal: Normal range of motion. He exhibits edema and tenderness.       Arms: Neurological: He is alert and oriented to person, place, and time. He has normal reflexes.  Skin: Skin is warm and dry. Rash noted.  Psychiatric: He has a normal mood and affect. His behavior is normal. Judgment and thought content normal.     Assessment/Plan Assessment: A 36 YO man here with right hand cellulitis and abscess. Most likely from un noticed scratch. CAMRSA most likely culprit. Patient will have I&D by Surgery. Plan#1 Cellulitis/Abscess: will admit for IV antibiotics, follow C&S after surgery, and adjust antibiotics accordingly, pain control and wound care. #2; Hypokalemia: Replete. Check magnesium #3 Dehydration: Hydrate #4 Leucocytosis: From cellulitis/abscess #5 History of Depression: Continue home medications.  Kwana Ringel,LAWAL 12/24/2011, 1:41 AM

## 2011-12-24 NOTE — Progress Notes (Signed)
[facility] PROGRESS NOTE  RALPHAEL SOUTHGATE WUJ:[id number] DOB: Dec 27, 1975 DOA: 12/23/2011 PCP: Sheila Oats, MD, MD  Assessment/Plan: 1. Hand infection: Empiric Unasyn and vancomycin pending culture. Pain control. Management per hand surgery. Check HIV. 2. Hypokalemia: Replete. 3. Dehydration: IV fluids.  Patient has no significant medical issues. Management per hand surgery.  Code Status: Full code Family Communication: Discussed with wife at bedside. Disposition Plan: Pending further evaluation and treatment  Brendia Sacks, MD  [facility] Pager [phone] 8AM-8PM  If 8PM-8AM, please contact floor/night-coverage [email or web address] Password [email or web address] 12/24/2011, 10:31 AM  LOS: 1 day   Brief narrative: 36 year old man admitted with hand infection.  Past medical history: Spontaneous pneumothorax  Consultants:  Hand surgery  Procedures:  April 22 Incision and debridement right hand  Antibiotics:  Unasyn  Vancomycin  Interim History: Chart reviewed and summarized as above. Pain control was poor earlier but with increased Dilaudid PCA patient is now comfortable.   Subjective: Pain controlled now, no other complaints.  Objective: Filed Vitals:   12/24/11 0540 12/24/11 0613 12/24/11 0848 12/24/11 1015  BP:      Pulse:      Temp:      TempSrc:      Resp: 16  18 20   SpO2: 96% 97% 97% 100%    Intake/Output Summary (Last 24 hours) at 12/24/11 1031 Last data filed at 12/24/11 0616  Gross per 24 hour  Intake 1268.33 ml  Output      1 ml  Net 1267.33 ml    Exam:   General:  Appears calm and comfortable.  Cardiovascular: Regular rate and rhythm. No murmur, rub, gallop.  Respiratory: Clear to auscultation bilaterally. No wheezes, rales, rhonchi. Normal respiratory effort.  Skin: Right hand and forearm bandaged. Outlined erythema remains within boundaries marked. Significant subcutaneous edema.  Psychiatric: Grossly normal mood  and affect. Speech fluent and appropriate.  Data Reviewed: Basic Metabolic Panel:  Lab 12/24/11 8657 12/23/11 1919  NA 132* 132*  K 3.0* 2.9*  CL 95* 93*  CO2 26 26  GLUCOSE 94 108*  BUN 6 10  CREATININE 0.67 0.76  CALCIUM 8.5 9.3  MG -- --  PHOS -- --   Liver Function Tests:  Lab 12/24/11 0700 12/23/11 1919  AST 18 18  ALT 25 31  ALKPHOS 115 104  BILITOT 0.2* 0.4  PROT 6.1 7.0  ALBUMIN 2.6* 3.0*   CBC:  Lab 12/24/11 0700 12/24/11 0113 12/23/11 1919  WBC 18.7* 19.2* 21.7*  NEUTROABS -- 15.4* 17.8*  HGB 12.6* 13.1 14.6  HCT 35.8* 36.3* 40.1  MCV 86.1 86.2 85.9  PLT 210 211 211   Recent Results (from the past 240 hour(s))  CULTURE, ROUTINE-ABSCESS     Status: Normal (Preliminary result)   Collection Time   12/23/11  9:56 PM      Component Value Range Status Comment   Specimen Description ABSCESS HAND RIGHT   Final    Special Requests PT ON BACTRUM,CLEOCIN,VANC   Final    Gram Stain     Final    Value: RARE WBC PRESENT,BOTH PMN AND MONONUCLEAR     NO ORGANISMS SEEN     Performed at [facility]   Culture NO GROWTH   Final    Report Status PENDING   Incomplete   GRAM STAIN     Status: Normal   Collection Time   12/23/11  9:56 PM      Component Value Range Status Comment   Specimen Description ABSCESS HAND  RIGHT   Final    Special Requests PT ON BACTRUM,CLEOCIN,VANC   Final    Gram Stain     Final    Value: RARE WBC PRESENT,BOTH PMN AND MONONUCLEAR     NO ORGANISMS SEEN   Report Status 12/23/2011 FINAL   Final     Studies: Dg Forearm Right  12/23/2011  *RADIOLOGY REPORT*  Clinical Data: Pain, redness and swelling of the right hand and forearm for the past 4 days.  RIGHT FOREARM - 2 VIEW  IMPRESSION: 1.  No acute radiographic abnormality of the right forearm. Specifically, no retained radiopaque foreign body identified.  Original Report Authenticated By: Florencia Reasons, M.D.   Dg Hand Complete Right  12/23/2011  *RADIOLOGY REPORT*  Clinical Data:  Pain and swelling in the right hand and forearm for the past 4 days, getting worse.  RIGHT HAND - COMPLETE 3+ VIEW IMPRESSION: 1.  Profound soft tissue swelling of the right hand overlying the metacarpals.  No unexpected retained radiopaque foreign body or underlying bony abnormality.  Original Report Authenticated By: Florencia Reasons, M.D.   Scheduled Meds:   . clindamycin (CLEOCIN) IV  600 mg Intravenous Once  . enoxaparin  30 mg Subcutaneous BID  . escitalopram  20 mg Oral Daily  .  HYDROmorphone (DILAUDID) injection  1 mg Intravenous Once  . HYDROmorphone PCA 0.3 mg/mL   Intravenous Q4H  . ondansetron (ZOFRAN) IV  4 mg Intravenous Once  . vancomycin  1,000 mg Intravenous Once  . DISCONTD: enoxaparin  30 mg Subcutaneous Q12H  . DISCONTD: HYDROmorphone PCA 0.3 mg/mL   Intravenous Q4H   Continuous Infusions:   . 0.9 % NaCl with KCl 40 mEq / L 100 mL/hr at 12/24/11 0135

## 2011-12-24 NOTE — Progress Notes (Signed)
Patient admited to unit 5100, room 5127 from PACU via stretcher.  Patient without distress.  RUE edema noted with mod amt redness that is marked.  Splint of gauze and elastic bandage clean, dry, and intact.  Positive CMS noted RUE without numbness or tingling.

## 2011-12-24 NOTE — Progress Notes (Signed)
Utilization review complete 

## 2011-12-24 NOTE — Progress Notes (Signed)
Patient ID: Hector Parks, male   DOB: 02/08/1976, 35 y.o.   MRN: [id number] Patient doing better this pm WBC 18k  Will repeat I and D tomorrow  Still somewhat erythematous proximally  ID consult recommended  

## 2011-12-24 NOTE — Progress Notes (Signed)
ANTIBIOTIC CONSULT NOTE - INITIAL  Pharmacy Consult for vancomycin, Unasyn Indication: cellulitis, abscess right hand  No Known Allergies  Patient Measurements:   Patient weight ~170#  Vital Signs: Temp: 99.6 F (37.6 C) (04/23 0408) Temp src: Oral (04/23 0408) BP: 128/76 mmHg (04/23 0408) Pulse Rate: 81  (04/23 0408) Intake/Output from previous day: 04/22 0701 - 04/23 0700 In: 1268.3 [I.V.:1268.3] Out: 1 [Urine:1] Intake/Output from this shift: Total I/O In: -  Out: 450 [Urine:450]  Labs:  [facility] 12/24/11 0700 12/24/11 0113 12/23/11 1919  WBC 18.7* 19.2* 21.7*  HGB 12.6* 13.1 14.6  PLT 210 211 211  LABCREA -- -- --  CREATININE 0.67 -- 0.76   CrCl is unknown because there is no height on file for the current visit. No results found for this basename: VANCOTROUGH:2,VANCOPEAK:2,VANCORANDOM:2,GENTTROUGH:2,GENTPEAK:2,GENTRANDOM:2,TOBRATROUGH:2,TOBRAPEAK:2,TOBRARND:2,AMIKACINPEAK:2,AMIKACINTROU:2,AMIKACIN:2, in the last 72 hours   Microbiology: Recent Results (from the past 720 hour(s))  CULTURE, ROUTINE-ABSCESS     Status: Normal (Preliminary result)   Collection Time   12/23/11  9:56 PM      Component Value Range Status Comment   Specimen Description ABSCESS HAND RIGHT   Final    Special Requests PT ON BACTRUM,CLEOCIN,VANC   Final    Gram Stain     Final    Value: RARE WBC PRESENT,BOTH PMN AND MONONUCLEAR     NO ORGANISMS SEEN     Performed at [facility]   Culture NO GROWTH   Final    Report Status PENDING   Incomplete   GRAM STAIN     Status: Normal   Collection Time   12/23/11  9:56 PM      Component Value Range Status Comment   Specimen Description ABSCESS HAND RIGHT   Final    Special Requests PT ON BACTRUM,CLEOCIN,VANC   Final    Gram Stain     Final    Value: RARE WBC PRESENT,BOTH PMN AND MONONUCLEAR     NO ORGANISMS SEEN   Report Status 12/23/2011 FINAL   Final     Medical History: Past Medical History  Diagnosis Date  . Spontaneous  pneumothorax     Medications:  Prescriptions prior to admission  Medication Sig Dispense Refill  . escitalopram (LEXAPRO) 20 MG tablet Take 20 mg by mouth daily.      Marland Kitchen sulfamethoxazole-trimethoprim (BACTRIM DS) 800-160 MG per tablet Take 1 tablet by mouth 2 (two) times daily.       Assessment: 36 yo man presents with right hand swelling.  Denies trauma or insect bite to hand.  Went to Urgent Care and was given Rx for septra but pain and swelling persisted so he came to ER.  I&D right hand 4/22 pm.  Goal of Therapy:  Vancomycin trough level 10-15 mcg/ml  Plan:  Unasyn 1.5 gm IV q6 hours. Vancomycin 1500 mg IV q12 hours. F/u cultures, clinical course and renal function.  Talbert Cage Poteet 12/24/2011,11:34 AM

## 2011-12-24 NOTE — Anesthesia Preprocedure Evaluation (Addendum)
Anesthesia Evaluation  Patient identified by MRN, date of birth, ID band Patient awake    Reviewed: Allergy & Precautions, H&P , NPO status , Patient's Chart, lab work & pertinent test results  Airway Mallampati: II  Neck ROM: full    Dental   Pulmonary Current Smoker,          Cardiovascular     Neuro/Psych PSYCHIATRIC DISORDERS Depression    GI/Hepatic   Endo/Other    Renal/GU      Musculoskeletal   Abdominal   Peds  Hematology   Anesthesia Other Findings   Reproductive/Obstetrics                           Anesthesia Physical Anesthesia Plan  ASA: II  Anesthesia Plan: General   Post-op Pain Management:    Induction: Intravenous  Airway Management Planned: LMA  Additional Equipment:   Intra-op Plan:   Post-operative Plan:   Informed Consent: I have reviewed the patients History and Physical, chart, labs and discussed the procedure including the risks, benefits and alternatives for the proposed anesthesia with the patient or authorized representative who has indicated his/her understanding and acceptance.     Plan Discussed with: CRNA and Surgeon  Anesthesia Plan Comments:         Anesthesia Quick Evaluation

## 2011-12-25 ENCOUNTER — Encounter (HOSPITAL_COMMUNITY)
Admission: EM | Disposition: A | Discharge status: Home Health | Payer: Self-pay | Source: Home / Self Care | Attending: Internal Medicine

## 2011-12-25 ENCOUNTER — Encounter (HOSPITAL_COMMUNITY): Payer: Self-pay | Admitting: Anesthesiology

## 2011-12-25 ENCOUNTER — Inpatient Hospital Stay (HOSPITAL_COMMUNITY): Payer: Medicaid Other | Admitting: Anesthesiology

## 2011-12-25 HISTORY — PX: I&D EXTREMITY: SHX5045

## 2011-12-25 LAB — HIV ANTIBODY (ROUTINE TESTING W REFLEX): HIV: NONREACTIVE

## 2011-12-25 LAB — CBC
MCV: 87.2 fL (ref 78.0–100.0)
Platelets: 267 10*3/uL (ref 150–400)
RBC: 4.21 MIL/uL — ABNORMAL LOW (ref 4.22–5.81)
RDW: 14.8 % (ref 11.5–15.5)
WBC: 16.6 10*3/uL — ABNORMAL HIGH (ref 4.0–10.5)

## 2011-12-25 LAB — BASIC METABOLIC PANEL
CO2: 28 mEq/L (ref 19–32)
Chloride: 99 mEq/L (ref 96–112)
GFR calc Af Amer: >90 mL/min (ref >90)
Potassium: 3.5 mEq/L (ref 3.5–5.1)
Sodium: 136 mEq/L (ref 135–145)

## 2011-12-25 LAB — SURGICAL PCR SCREEN
MRSA, PCR: NEGATIVE
Staphylococcus aureus: NEGATIVE

## 2011-12-25 LAB — GRAM STAIN

## 2011-12-25 SURGERY — IRRIGATION AND DEBRIDEMENT EXTREMITY
Anesthesia: General | Site: Hand | Laterality: Right | Wound class: Dirty or Infected

## 2011-12-25 MED ORDER — LIDOCAINE HCL (CARDIAC) 20 MG/ML IV SOLN
INTRAVENOUS | Status: DC | PRN
  Administered 2011-12-25: 60 mg via INTRAVENOUS

## 2011-12-25 MED ORDER — LACTATED RINGERS IV SOLN
INTRAVENOUS | Status: DC
  Administered 2011-12-25: 16:00:00 via INTRAVENOUS

## 2011-12-25 MED ORDER — MIDAZOLAM HCL 5 MG/5ML IJ SOLN
INTRAMUSCULAR | Status: DC | PRN
  Administered 2011-12-25: 2 mg via INTRAVENOUS

## 2011-12-25 MED ORDER — POTASSIUM CHLORIDE IN NACL 40-0.9 MEQ/L-% IV SOLN
INTRAVENOUS | Status: DC
  Administered 2011-12-25: 20:00:00 via INTRAVENOUS
  Filled 2011-12-25 (×3): qty 1000

## 2011-12-25 MED ORDER — SODIUM CHLORIDE 0.9 % IR SOLN
Status: DC | PRN
  Administered 2011-12-25: 1000 mL

## 2011-12-25 MED ORDER — POTASSIUM CHLORIDE CRYS ER 20 MEQ PO TBCR: 40.0000 meq | EXTENDED_RELEASE_TABLET | Freq: Once | ORAL | Status: DC

## 2011-12-25 MED ORDER — HYDROMORPHONE HCL PF 1 MG/ML IJ SOLN
INTRAMUSCULAR | Status: AC
  Filled 2011-12-25: qty 1

## 2011-12-25 MED ORDER — ENOXAPARIN SODIUM 30 MG/0.3ML ~~LOC~~ SOLN
30.0000 mg | Freq: Two times a day (BID) | SUBCUTANEOUS | Status: DC
  Administered 2011-12-26 – 2011-12-29 (×7): 30 mg via SUBCUTANEOUS
  Filled 2011-12-25 (×9): qty 0.3

## 2011-12-25 MED ORDER — PROPOFOL 10 MG/ML IV EMUL
INTRAVENOUS | Status: DC | PRN
  Administered 2011-12-25: 200 mg via INTRAVENOUS

## 2011-12-25 MED ORDER — ONDANSETRON HCL 4 MG/2ML IJ SOLN
INTRAMUSCULAR | Status: DC | PRN
  Administered 2011-12-25: 4 mg via INTRAVENOUS

## 2011-12-25 MED ORDER — ENOXAPARIN SODIUM 30 MG/0.3ML ~~LOC~~ SOLN: 30.0000 mg | Freq: Two times a day (BID) | SUBCUTANEOUS | Status: DC

## 2011-12-25 MED ORDER — ALPRAZOLAM 0.5 MG PO TABS
0.5000 mg | ORAL_TABLET | Freq: Every evening | ORAL | Status: DC | PRN
  Administered 2011-12-26 – 2011-12-28 (×2): 0.5 mg via ORAL
  Filled 2011-12-25 (×2): qty 1

## 2011-12-25 MED ORDER — HYDROMORPHONE HCL PF 1 MG/ML IJ SOLN
0.2500 mg | INTRAMUSCULAR | Status: DC | PRN
  Administered 2011-12-25 (×4): 0.5 mg via INTRAVENOUS

## 2011-12-25 MED ORDER — ONDANSETRON HCL 4 MG/2ML IJ SOLN: 4.0000 mg | Freq: Four times a day (QID) | INTRAMUSCULAR | Status: DC | PRN

## 2011-12-25 MED ORDER — DIPHENHYDRAMINE HCL 50 MG/ML IJ SOLN
INTRAMUSCULAR | Status: AC
  Administered 2011-12-25: 12.5 mg via INTRAVENOUS
  Filled 2011-12-25: qty 1

## 2011-12-25 MED ORDER — VANCOMYCIN HCL 1000 MG IV SOLR
1500.0000 mg | Freq: Two times a day (BID) | INTRAVENOUS | Status: DC
  Administered 2011-12-25 – 2011-12-27 (×4): 1500 mg via INTRAVENOUS
  Filled 2011-12-25 (×5): qty 1500

## 2011-12-25 MED ORDER — HYDROMORPHONE 0.3 MG/ML IV SOLN
INTRAVENOUS | Status: AC
  Filled 2011-12-25: qty 25

## 2011-12-25 MED ORDER — POTASSIUM CHLORIDE CRYS ER 20 MEQ PO TBCR
20.0000 meq | EXTENDED_RELEASE_TABLET | Freq: Once | ORAL | Status: AC
  Administered 2011-12-25: 20 meq via ORAL
  Filled 2011-12-25: qty 1

## 2011-12-25 MED ORDER — SODIUM CHLORIDE 0.9 % IV SOLN
1.0000 g | Freq: Three times a day (TID) | INTRAVENOUS | Status: DC
  Administered 2011-12-26 – 2011-12-27 (×4): 1 g via INTRAVENOUS
  Filled 2011-12-25 (×7): qty 1

## 2011-12-25 MED ORDER — LACTATED RINGERS IV SOLN
INTRAVENOUS | Status: DC | PRN
  Administered 2011-12-25: 16:00:00 via INTRAVENOUS

## 2011-12-25 MED ORDER — FENTANYL CITRATE 0.05 MG/ML IJ SOLN
INTRAMUSCULAR | Status: DC | PRN
  Administered 2011-12-25 (×6): 25 ug via INTRAVENOUS
  Administered 2011-12-25 (×2): 50 ug via INTRAVENOUS

## 2011-12-25 SURGICAL SUPPLY — 49 items
BANDAGE CONFORM 2  STR LF (GAUZE/BANDAGES/DRESSINGS) IMPLANT
BANDAGE ELASTIC 3 VELCRO ST LF (GAUZE/BANDAGES/DRESSINGS) ×2 IMPLANT
BANDAGE ELASTIC 4 VELCRO ST LF (GAUZE/BANDAGES/DRESSINGS) ×2 IMPLANT
BANDAGE GAUZE ELAST BULKY 4 IN (GAUZE/BANDAGES/DRESSINGS) ×2 IMPLANT
BNDG CMPR 9X4 STRL LF SNTH (GAUZE/BANDAGES/DRESSINGS)
BNDG ESMARK 4X9 LF (GAUZE/BANDAGES/DRESSINGS) IMPLANT
CLOTH BEACON ORANGE TIMEOUT ST (SAFETY) ×2 IMPLANT
CORDS BIPOLAR (ELECTRODE) IMPLANT
COVER SURGICAL LIGHT HANDLE (MISCELLANEOUS) ×4 IMPLANT
CUFF TOURNIQUET SINGLE 18IN (TOURNIQUET CUFF) ×2 IMPLANT
DECANTER SPIKE VIAL GLASS SM (MISCELLANEOUS) ×2 IMPLANT
DRAIN PENROSE 1/4X12 LTX STRL (WOUND CARE) IMPLANT
DRAPE OEC MINIVIEW 54X84 (DRAPES) IMPLANT
DRAPE SURG 17X23 STRL (DRAPES) ×2 IMPLANT
DRSG PAD ABDOMINAL 8X10 ST (GAUZE/BANDAGES/DRESSINGS) ×2 IMPLANT
DURAPREP 26ML APPLICATOR (WOUND CARE) IMPLANT
ELECT REM PT RETURN 9FT ADLT (ELECTROSURGICAL)
ELECTRODE REM PT RTRN 9FT ADLT (ELECTROSURGICAL) IMPLANT
GAUZE PACKING IODOFORM 1/4X5 (PACKING) IMPLANT
GAUZE XEROFORM 1X8 LF (GAUZE/BANDAGES/DRESSINGS) ×2 IMPLANT
GLOVE BIO SURGEON STRL SZ8.5 (GLOVE) ×2 IMPLANT
GOWN SRG XL XLNG 56XLVL 4 (GOWN DISPOSABLE) ×1 IMPLANT
GOWN STRL NON-REIN LRG LVL3 (GOWN DISPOSABLE) ×2 IMPLANT
GOWN STRL NON-REIN XL XLG LVL4 (GOWN DISPOSABLE) ×2
HANDPIECE INTERPULSE COAX TIP (DISPOSABLE)
KIT BASIN OR (CUSTOM PROCEDURE TRAY) ×2 IMPLANT
KIT ROOM TURNOVER OR (KITS) ×2 IMPLANT
MANIFOLD NEPTUNE II (INSTRUMENTS) ×2 IMPLANT
NEEDLE HYPO 25GX1X1/2 BEV (NEEDLE) IMPLANT
NEEDLE HYPO 25X1 1.5 SAFETY (NEEDLE) ×2 IMPLANT
NS IRRIG 1000ML POUR BTL (IV SOLUTION) ×2 IMPLANT
PACK ORTHO EXTREMITY (CUSTOM PROCEDURE TRAY) ×2 IMPLANT
PAD ARMBOARD 7.5X6 YLW CONV (MISCELLANEOUS) ×4 IMPLANT
PAD CAST 4YDX4 CTTN HI CHSV (CAST SUPPLIES) ×2 IMPLANT
PADDING CAST COTTON 4X4 STRL (CAST SUPPLIES) ×4
SET HNDPC FAN SPRY TIP SCT (DISPOSABLE) IMPLANT
SPONGE GAUZE 4X4 12PLY (GAUZE/BANDAGES/DRESSINGS) ×2 IMPLANT
SPONGE LAP 18X18 X RAY DECT (DISPOSABLE) ×2 IMPLANT
SUCTION FRAZIER TIP 10 FR DISP (SUCTIONS) ×2 IMPLANT
SUT VICRYL RAPIDE 4/0 PS 2 (SUTURE) IMPLANT
SYR 20CC LL (SYRINGE) IMPLANT
SYR CONTROL 10ML LL (SYRINGE) ×2 IMPLANT
TOWEL OR 17X24 6PK STRL BLUE (TOWEL DISPOSABLE) ×2 IMPLANT
TOWEL OR 17X26 10 PK STRL BLUE (TOWEL DISPOSABLE) ×2 IMPLANT
TUBE ANAEROBIC SPECIMEN COL (MISCELLANEOUS) IMPLANT
TUBE CONNECTING 12X1/4 (SUCTIONS) ×2 IMPLANT
UNDERPAD 30X30 INCONTINENT (UNDERPADS AND DIAPERS) ×2 IMPLANT
WATER STERILE IRR 1000ML POUR (IV SOLUTION) ×2 IMPLANT
YANKAUER SUCT BULB TIP NO VENT (SUCTIONS) ×2 IMPLANT

## 2011-12-25 NOTE — Interval H&P Note (Signed)
History and Physical Interval Note:  12/25/2011 4:43 PM  Hector Parks  has presented today for surgery, with the diagnosis of RIGHT HAND ABCESS  The various methods of treatment have been discussed with the patient and family. After consideration of risks, benefits and other options for treatment, the patient has consented to  Procedure(s) (LRB): IRRIGATION AND DEBRIDEMENT EXTREMITY (Right) as a surgical intervention .  The patients' history has been reviewed, patient examined, no change in status, stable for surgery.  I have reviewed the patients' chart and labs.  Questions were answered to the patient's satisfaction.     Dairl Ponder A

## 2011-12-25 NOTE — H&P (View-Only) (Signed)
Patient ID: Hector Parks, male   DOB: 1976/02/19, 36 y.o.   MRN: [id number] Patient doing better this pm WBC 18k  Will repeat I and D tomorrow  Still somewhat erythematous proximally  ID consult recommended

## 2011-12-25 NOTE — Op Note (Signed)
See dictated note [phone]

## 2011-12-25 NOTE — Progress Notes (Signed)
Patient Demographics  Hector Parks, is a 36 y.o. male  JXB:[id number]  ZHY:[id number]  DOB - 1976/05/27  12/23/2011  Admitting Physician Rometta Emery, MD  Outpatient Primary MD for the patient is DEFAULT,PROVIDER, MD, MD  LOS 2     Chief Complaint  Patient presents with  . Cellulitis        Subjective:   Hector Parks today has, No headache, No chest pain, No abdominal pain - No Nausea, No new weakness tingling or numbness, No Cough - SOB.Rt hand pain.  Objective:   Filed Vitals:   12/25/11 0550 12/25/11 0604 12/25/11 1051 12/25/11 1457  BP: 99/64  128/75 118/71  Pulse: 72  64 72  Temp: 98.9 F (37.2 C)  98.4 F (36.9 C) 98.8 F (37.1 C)  TempSrc:      Resp: 11 12 18 19   SpO2: 93%  95% 95%    Wt Readings from Last 3 Encounters:  No data found for Wt     Intake/Output Summary (Last 24 hours) at 12/25/11 1507 Last data filed at 12/25/11 1400  Gross per 24 hour  Intake   2600 ml  Output      0 ml  Net   2600 ml    Exam Awake Alert, Oriented *3, No new F.N deficits, Normal affect Momence.AT,PERRAL Supple Neck,No JVD, No cervical lymphadenopathy appriciated.  Symmetrical Chest wall movement, Good air movement bilaterally, CTAB RRR,No Gallops,Rubs or new Murmurs, No Parasternal Heave +ve B.Sounds, Abd Soft, Non tender, No organomegaly appriciated, No rebound -guarding or rigidity. No Cyanosis, Clubbing or edema, No new Rash or bruise, except Rt hand in bandage, redness and swelling up to axilla.  Data Review  CBC  Lab 12/25/11 0515 12/24/11 0700 12/24/11 0113 12/23/11 1919  WBC 16.6* 18.7* 19.2* 21.7*  HGB 12.9* 12.6* 13.1 14.6  HCT 36.7* 35.8* 36.3* 40.1  PLT 267 210 211 211  MCV 87.2 86.1 86.2 85.9  MCH 30.6 30.3 31.1 31.3  MCHC 35.1 35.2 36.1* 36.4*  RDW 14.8 14.5 14.3 14.2  LYMPHSABS -- -- 2.5 1.8  MONOABS -- -- 1.2* 2.0*  EOSABS -- -- 0.1 0.0  BASOSABS -- -- 0.0 0.0  BANDABS -- -- -- --    Chemistries   Lab 12/25/11 0515 12/24/11 0700  12/23/11 1919  NA 136 132* 132*  K 3.5 3.0* 2.9*  CL 99 95* 93*  CO2 28 26 26   GLUCOSE 99 94 108*  BUN <3* 6 10  CREATININE 0.66 0.67 0.76  CALCIUM 8.8 8.5 9.3  MG -- -- --  AST -- 18 18  ALT -- 25 31  ALKPHOS -- 115 104  BILITOT -- 0.2* 0.4   ------------------------------------------------------------------------------------------------------------------ CrCl is unknown because there is no height on file for the current visit. ------------------------------------------------------------------------------------------------------------------ No results found for this basename: HGBA1C:2 in the last 72 hours ------------------------------------------------------------------------------------------------------------------ No results found for this basename: CHOL:2,HDL:2,LDLCALC:2,TRIG:2,CHOLHDL:2,LDLDIRECT:2 in the last 72 hours ------------------------------------------------------------------------------------------------------------------ No results found for this basename: TSH,T4TOTAL,FREET3,T3FREE,THYROIDAB in the last 72 hours ------------------------------------------------------------------------------------------------------------------ No results found for this basename: VITAMINB12:2,FOLATE:2,FERRITIN:2,TIBC:2,IRON:2,RETICCTPCT:2 in the last 72 hours  Coagulation profile No results found for this basename: INR:5,PROTIME:5 in the last 168 hours  No results found for this basename: DDIMER:2 in the last 72 hours  Cardiac Enzymes No results found for this basename: CK:3,CKMB:3,TROPONINI:3,MYOGLOBIN:3 in the last 168 hours ------------------------------------------------------------------------------------------------------------------ No components found with this basename: POCBNP:3  Micro Results Recent Results (from the past 240 hour(s))  CULTURE, BLOOD (ROUTINE X 2)     Status: Normal (  Preliminary result)   Collection Time   12/23/11  7:25 PM      Component Value Range  Status Comment   Specimen Description BLOOD HAND LEFT   Final    Special Requests BOTTLES DRAWN AEROBIC ONLY 5CC   Final    Culture  Setup Time [id number]   Final    Culture     Final    Value:        BLOOD CULTURE RECEIVED NO GROWTH TO DATE CULTURE WILL BE HELD FOR 5 DAYS BEFORE ISSUING A FINAL NEGATIVE REPORT   Report Status PENDING   Incomplete   CULTURE, BLOOD (ROUTINE X 2)     Status: Normal (Preliminary result)   Collection Time   12/23/11  7:36 PM      Component Value Range Status Comment   Specimen Description BLOOD ARM LEFT   Final    Special Requests     Final    Value: BOTTLES DRAWN AEROBIC AND ANAEROBIC AERO 10CC, ANAE 8CC   Culture  Setup Time [id number]   Final    Culture     Final    Value:        BLOOD CULTURE RECEIVED NO GROWTH TO DATE CULTURE WILL BE HELD FOR 5 DAYS BEFORE ISSUING A FINAL NEGATIVE REPORT   Report Status PENDING   Incomplete   CULTURE, ROUTINE-ABSCESS     Status: Normal (Preliminary result)   Collection Time   12/23/11  9:56 PM      Component Value Range Status Comment   Specimen Description ABSCESS HAND RIGHT   Final    Special Requests PT ON BACTRUM,CLEOCIN,VANC   Final    Gram Stain     Final    Value: RARE WBC PRESENT,BOTH PMN AND MONONUCLEAR     NO ORGANISMS SEEN     Performed at [facility]   Culture FEW GROUP A STREP (S.PYOGENES) ISOLATED   Final    Report Status PENDING   Incomplete   ANAEROBIC CULTURE     Status: Normal (Preliminary result)   Collection Time   12/23/11  9:56 PM      Component Value Range Status Comment   Specimen Description ABSCESS HAND RIGHT   Final    Special Requests PT ON BACTRUM,CLEOCIN,VANC   Final    Gram Stain PENDING   Incomplete    Culture     Final    Value: NO ANAEROBES ISOLATED; CULTURE IN PROGRESS FOR 5 DAYS   Report Status PENDING   Incomplete   GRAM STAIN     Status: Normal   Collection Time   12/23/11  9:56 PM      Component Value Range Status Comment   Specimen Description ABSCESS HAND  RIGHT   Final    Special Requests PT ON BACTRUM,CLEOCIN,VANC   Final    Gram Stain     Final    Value: RARE WBC PRESENT,BOTH PMN AND MONONUCLEAR     NO ORGANISMS SEEN   Report Status 12/23/2011 FINAL   Final   SURGICAL PCR SCREEN     Status: Normal   Collection Time   12/25/11  6:50 AM      Component Value Range Status Comment   MRSA, PCR NEGATIVE  NEGATIVE  Final    Staphylococcus aureus NEGATIVE  NEGATIVE  Final     Radiology Reports Dg Forearm Right  12/23/2011  *RADIOLOGY REPORT*  Clinical Data: Pain, redness and swelling of the right hand and forearm for the  past 4 days.  RIGHT FOREARM - 2 VIEW  Comparison: No priors.  Findings: AP and lateral radiographs of the right forearm demonstrate no acute fracture or retained radiopaque foreign body. Overlying soft tissues appear mildly swollen.  IMPRESSION: 1.  No acute radiographic abnormality of the right forearm. Specifically, no retained radiopaque foreign body identified.  Original Report Authenticated By: Florencia Reasons, M.D.   Dg Hand Complete Right  12/23/2011  *RADIOLOGY REPORT*  Clinical Data: Pain and swelling in the right hand and forearm for the past 4 days, getting worse.  RIGHT HAND - COMPLETE 3+ VIEW  Comparison: No priors.  Findings: Multiple views of the right hand demonstrate no acute fracture, subluxation, dislocation, retained radiopaque foreign body or joint abnormality.  There is profound soft tissue swelling in the right hand, particularly overlying the region of the metacarpals.  IMPRESSION: 1.  Profound soft tissue swelling of the right hand overlying the metacarpals.  No unexpected retained radiopaque foreign body or underlying bony abnormality.  Original Report Authenticated By: Florencia Reasons, M.D.    Scheduled Meds:   . enoxaparin  30 mg Subcutaneous BID  . escitalopram  20 mg Oral Daily  . HYDROmorphone PCA 0.3 mg/mL   Intravenous Q4H  . potassium chloride  20 mEq Oral Once  . vancomycin  1,500 mg  Intravenous Q12H  . DISCONTD: ampicillin-sulbactam (UNASYN) IV  1.5 g Intravenous Q6H  . DISCONTD: potassium chloride  40 mEq Oral Once  . DISCONTD: vancomycin  1,500 mg Intravenous Q12H   Continuous Infusions:   . 0.9 % NaCl with KCl 40 mEq / L    . DISCONTD: 0.9 % NaCl with KCl 40 mEq / L 100 mL/hr at 12/25/11 0611   PRN Meds:.acetaminophen, acetaminophen, diphenhydrAMINE, diphenhydrAMINE, naloxone, ondansetron (ZOFRAN) IV, oxyCODONE, sodium chloride  Assessment & Plan   1. R. Cellulitis and abscess with Leukocytosis- hand surgery following I&D on 4-22 and now again on 4-24, IV Vanco continue change Unasyn to Meropenam for psedo coverage, pt denies any IV drug use asked repeatedly, ID called, will get CT arm today.  2. Hypokalemia - better, replaced, monitor.   3.Depression - home meds.    DVT Prophylaxis  Lovenox       Leroy Sea M.D on 12/25/2011 at 3:07 PM  [facility] Office  [phone]

## 2011-12-25 NOTE — Anesthesia Procedure Notes (Signed)
Procedure Name: LMA Insertion Date/Time: 12/25/2011 4:57 PM Performed by: Elon Alas Pre-anesthesia Checklist: Patient identified, Timeout performed, Emergency Drugs available, Suction available and Patient being monitored Patient Re-evaluated:Patient Re-evaluated prior to inductionOxygen Delivery Method: Circle system utilized Preoxygenation: Pre-oxygenation with 100% oxygen Intubation Type: IV induction Ventilation: Mask ventilation without difficulty LMA Size: 5.0 Number of attempts: 1 Placement Confirmation: positive ETCO2 and breath sounds checked- equal and bilateral Tube secured with: Tape Dental Injury: Teeth and Oropharynx as per pre-operative assessment

## 2011-12-25 NOTE — Transfer of Care (Signed)
Immediate Anesthesia Transfer of Care Note  Patient: Hector Parks  Procedure(s) Performed: Procedure(s) (LRB): IRRIGATION AND DEBRIDEMENT EXTREMITY (Right)  Patient Location: PACU  Anesthesia Type: General  Level of Consciousness: awake, alert  and oriented  Airway & Oxygen Therapy: Patient Spontanous Breathing and Patient connected to nasal cannula oxygen  Post-op Assessment: Report given to PACU RN, Post -op Vital signs reviewed and stable and Patient moving all extremities X 4  Post vital signs: Reviewed and stable  Complications: No apparent anesthesia complications

## 2011-12-25 NOTE — Brief Op Note (Signed)
12/23/2011 - 12/25/2011  5:24 PM  PATIENT:  Marijo Conception  36 y.o. male  PRE-OPERATIVE DIAGNOSIS:  RIGHT HAND ABCESS  POST-OPERATIVE DIAGNOSIS:  * No post-op diagnosis entered *  PROCEDURE:  Procedure(s) (LRB): IRRIGATION AND DEBRIDEMENT EXTREMITY (Right)  SURGEON:  Surgeon(s) and Role:    * Marlowe Shores, MD - Primary  PHYSICIAN ASSISTANT:   ASSISTANTS: none   ANESTHESIA:   general  EBL:  Total I/O In: 1393.3 [I.V.:1393.3] Out: 20 [Blood:20]  BLOOD ADMINISTERED:none  DRAINS: Penrose drain in the dorsal hand   LOCAL MEDICATIONS USED:  NONE  SPECIMEN:  No Specimen  DISPOSITION OF SPECIMEN:  N/A  COUNTS:  YES  TOURNIQUET:  * Missing tourniquet times found for documented tourniquets in log:  [id number] *  DICTATION: .Other Dictation: Dictation Number [id number]  PLAN OF CARE: Admit to inpatient   PATIENT DISPOSITION:  PACU - hemodynamically stable.   Delay start of Pharmacological VTE agent (>24hrs) due to surgical blood loss or risk of bleeding: yes

## 2011-12-25 NOTE — Progress Notes (Signed)
Pt requesting medication to help him sleep.  Per pt, has been feeling anxious.  Notified Dr. Mina Marble, who ordered Xanax prn.  Will continue to monitor.

## 2011-12-25 NOTE — Preoperative (Signed)
Beta Blockers   Reason not to administer Beta Blockers:Not Applicable 

## 2011-12-26 ENCOUNTER — Encounter (HOSPITAL_COMMUNITY): Payer: Self-pay | Admitting: Orthopedic Surgery

## 2011-12-26 ENCOUNTER — Inpatient Hospital Stay (HOSPITAL_COMMUNITY): Payer: Medicaid Other

## 2011-12-26 ENCOUNTER — Encounter (HOSPITAL_COMMUNITY)
Admission: EM | Disposition: A | Discharge status: Home Health | Payer: Self-pay | Source: Home / Self Care | Attending: Internal Medicine

## 2011-12-26 ENCOUNTER — Inpatient Hospital Stay (HOSPITAL_COMMUNITY): Payer: Medicaid Other | Admitting: Anesthesiology

## 2011-12-26 ENCOUNTER — Encounter (HOSPITAL_COMMUNITY): Payer: Self-pay | Admitting: Anesthesiology

## 2011-12-26 HISTORY — PX: I&D EXTREMITY: SHX5045

## 2011-12-26 LAB — CULTURE, ROUTINE-ABSCESS

## 2011-12-26 LAB — BASIC METABOLIC PANEL
BUN: 3 mg/dL — ABNORMAL LOW (ref 6–23)
Calcium: 8.9 mg/dL (ref 8.4–10.5)
Creatinine, Ser: 0.63 mg/dL (ref 0.50–1.35)
GFR calc Af Amer: >90 mL/min (ref >90)
GFR calc non Af Amer: >90 mL/min (ref >90)
Glucose, Bld: 89 mg/dL (ref 70–99)

## 2011-12-26 LAB — CBC
HCT: 37.7 % — ABNORMAL LOW (ref 39.0–52.0)
Hemoglobin: 13.1 g/dL (ref 13.0–17.0)
MCH: 30.6 pg (ref 26.0–34.0)
MCHC: 34.7 g/dL (ref 30.0–36.0)
MCV: 88.1 fL (ref 78.0–100.0)
RDW: 15.1 % (ref 11.5–15.5)

## 2011-12-26 SURGERY — IRRIGATION AND DEBRIDEMENT EXTREMITY
Anesthesia: General | Site: Arm Upper | Laterality: Right | Wound class: Dirty or Infected

## 2011-12-26 MED ORDER — SODIUM CHLORIDE 0.9 % IJ SOLN
10.0000 mL | INTRAMUSCULAR | Status: DC | PRN
  Administered 2011-12-27 – 2011-12-28 (×2): 10 mL

## 2011-12-26 MED ORDER — POTASSIUM CHLORIDE IN NACL 40-0.9 MEQ/L-% IV SOLN
INTRAVENOUS | Status: AC
  Administered 2011-12-26: 15:00:00 via INTRAVENOUS
  Filled 2011-12-26: qty 1000

## 2011-12-26 MED ORDER — GADOBENATE DIMEGLUMINE 529 MG/ML IV SOLN
17.0000 mL | Freq: Once | INTRAVENOUS | Status: AC
  Administered 2011-12-26: 17 mL via INTRAVENOUS

## 2011-12-26 MED ORDER — PROPOFOL 10 MG/ML IV BOLUS
INTRAVENOUS | Status: DC | PRN
  Administered 2011-12-26: 200 mg via INTRAVENOUS

## 2011-12-26 MED ORDER — SUCCINYLCHOLINE CHLORIDE 20 MG/ML IJ SOLN
INTRAMUSCULAR | Status: DC | PRN
  Administered 2011-12-26: 100 mg via INTRAVENOUS

## 2011-12-26 MED ORDER — CHLORHEXIDINE GLUCONATE 4 % EX LIQD
60.0000 mL | Freq: Once | CUTANEOUS | Status: DC
  Filled 2011-12-26: qty 60

## 2011-12-26 MED ORDER — LACTATED RINGERS IV SOLN
INTRAVENOUS | Status: DC | PRN
  Administered 2011-12-26 – 2011-12-27 (×2): via INTRAVENOUS

## 2011-12-26 MED ORDER — LIDOCAINE HCL (CARDIAC) 20 MG/ML IV SOLN
INTRAVENOUS | Status: DC | PRN
  Administered 2011-12-26: 100 mg via INTRAVENOUS

## 2011-12-26 MED ORDER — CLINDAMYCIN PHOSPHATE 900 MG/50ML IV SOLN
900.0000 mg | Freq: Three times a day (TID) | INTRAVENOUS | Status: DC
  Administered 2011-12-26 – 2011-12-27 (×3): 900 mg via INTRAVENOUS
  Filled 2011-12-26 (×6): qty 50

## 2011-12-26 MED ORDER — FENTANYL CITRATE 0.05 MG/ML IJ SOLN
INTRAMUSCULAR | Status: DC | PRN
  Administered 2011-12-26 – 2011-12-27 (×3): 50 ug via INTRAVENOUS

## 2011-12-26 MED ORDER — MIDAZOLAM HCL 5 MG/5ML IJ SOLN
INTRAMUSCULAR | Status: DC | PRN
  Administered 2011-12-26: 2 mg via INTRAVENOUS

## 2011-12-26 SURGICAL SUPPLY — 50 items
BANDAGE COBAN STERILE 2 (GAUZE/BANDAGES/DRESSINGS) IMPLANT
BANDAGE CONFORM 2  STR LF (GAUZE/BANDAGES/DRESSINGS) IMPLANT
BANDAGE ELASTIC 3 VELCRO ST LF (GAUZE/BANDAGES/DRESSINGS) ×2 IMPLANT
BANDAGE ELASTIC 4 VELCRO ST LF (GAUZE/BANDAGES/DRESSINGS) ×2 IMPLANT
BANDAGE GAUZE ELAST BULKY 4 IN (GAUZE/BANDAGES/DRESSINGS) ×2 IMPLANT
BNDG COHESIVE 1X5 TAN STRL LF (GAUZE/BANDAGES/DRESSINGS) IMPLANT
BNDG ESMARK 4X9 LF (GAUZE/BANDAGES/DRESSINGS) IMPLANT
CLOTH BEACON ORANGE TIMEOUT ST (SAFETY) ×2 IMPLANT
CORDS BIPOLAR (ELECTRODE) ×2 IMPLANT
COVER SURGICAL LIGHT HANDLE (MISCELLANEOUS) ×2 IMPLANT
DECANTER SPIKE VIAL GLASS SM (MISCELLANEOUS) ×2 IMPLANT
DRAIN PENROSE 1/4X12 LTX STRL (WOUND CARE) IMPLANT
DRSG ADAPTIC 3X8 NADH LF (GAUZE/BANDAGES/DRESSINGS) ×2 IMPLANT
DRSG EMULSION OIL 3X3 NADH (GAUZE/BANDAGES/DRESSINGS) ×2 IMPLANT
DRSG PAD ABDOMINAL 8X10 ST (GAUZE/BANDAGES/DRESSINGS) ×2 IMPLANT
GAUZE PACKING IODOFORM 1/2 (PACKING) ×2 IMPLANT
GAUZE XEROFORM 1X8 LF (GAUZE/BANDAGES/DRESSINGS) ×2 IMPLANT
GAUZE XEROFORM 5X9 LF (GAUZE/BANDAGES/DRESSINGS) ×2 IMPLANT
GLOVE BIO SURGEON STRL SZ7.5 (GLOVE) ×2 IMPLANT
GLOVE BIOGEL PI IND STRL 8 (GLOVE) ×1 IMPLANT
GLOVE BIOGEL PI INDICATOR 8 (GLOVE) ×1
GOWN STRL REIN XL XLG (GOWN DISPOSABLE) ×2 IMPLANT
HANDPIECE INTERPULSE COAX TIP (DISPOSABLE)
KIT BASIN OR (CUSTOM PROCEDURE TRAY) ×2 IMPLANT
KIT ROOM TURNOVER OR (KITS) ×2 IMPLANT
LOOP VESSEL MAXI BLUE (MISCELLANEOUS) IMPLANT
LOOP VESSEL MINI RED (MISCELLANEOUS) IMPLANT
MANIFOLD NEPTUNE II (INSTRUMENTS) ×2 IMPLANT
NEEDLE HYPO 25X1 1.5 SAFETY (NEEDLE) IMPLANT
NS IRRIG 1000ML POUR BTL (IV SOLUTION) ×2 IMPLANT
PACK ORTHO EXTREMITY (CUSTOM PROCEDURE TRAY) ×2 IMPLANT
PAD ARMBOARD 7.5X6 YLW CONV (MISCELLANEOUS) ×4 IMPLANT
SCRUB BETADINE 4OZ XXX (MISCELLANEOUS) ×2 IMPLANT
SET HNDPC FAN SPRY TIP SCT (DISPOSABLE) IMPLANT
SOLUTION BETADINE 4OZ (MISCELLANEOUS) ×2 IMPLANT
SPONGE GAUZE 4X4 12PLY (GAUZE/BANDAGES/DRESSINGS) ×2 IMPLANT
SPONGE LAP 18X18 X RAY DECT (DISPOSABLE) ×2 IMPLANT
SPONGE LAP 4X18 X RAY DECT (DISPOSABLE) ×2 IMPLANT
SUCTION FRAZIER TIP 10 FR DISP (SUCTIONS) ×2 IMPLANT
SUT ETHILON 4 0 PS 2 18 (SUTURE) ×2 IMPLANT
SUT MON AB 5-0 P3 18 (SUTURE) IMPLANT
SYR CONTROL 10ML LL (SYRINGE) IMPLANT
TOWEL OR 17X24 6PK STRL BLUE (TOWEL DISPOSABLE) ×2 IMPLANT
TOWEL OR 17X26 10 PK STRL BLUE (TOWEL DISPOSABLE) ×2 IMPLANT
TUBE ANAEROBIC SPECIMEN COL (MISCELLANEOUS) IMPLANT
TUBE CONNECTING 12X1/4 (SUCTIONS) ×2 IMPLANT
TUBE FEEDING 5FR 15 INCH (TUBING) IMPLANT
UNDERPAD 30X30 INCONTINENT (UNDERPADS AND DIAPERS) ×2 IMPLANT
WATER STERILE IRR 1000ML POUR (IV SOLUTION) ×2 IMPLANT
YANKAUER SUCT BULB TIP NO VENT (SUCTIONS) ×2 IMPLANT

## 2011-12-26 NOTE — Progress Notes (Signed)
Patient ID: Hector Parks, male   DOB: 1976-04-21, 36 y.o.   MRN: [id number] Patient much more comfortable this am  Wounds closed last night  Will need to see in my office next week  Will check WBC this am   Erythema much improved this am if WBC closer to normal MRI may not be needed

## 2011-12-26 NOTE — Progress Notes (Signed)
   CARE MANAGEMENT NOTE 12/26/2011  Patient:  EDAHI, KROENING   Account Number:  [id number]  Date Initiated:  12/25/2011  Documentation initiated by:  Donn Pierini  Subjective/Objective Assessment:   Pt admitted with cellulitis and abscess     Action/Plan:   PTA pt lived at home with wife- was independent- surgical I&D   Anticipated DC Date:  12/30/2011   Anticipated DC Plan:  HOME W HOME HEALTH SERVICES  In-house referral  Clinical Social Worker      DC Associate Professor  CM consult      [facility] Choice  HOME HEALTH   Choice offered to / List presented to:  C-1 Patient        HH arranged  HH-1 RN      [facility] agency  [facility]   Status of service:  In process, will continue to follow Medicare Important Message given?   (If response is "NO", the following Medicare IM given date fields will be blank) Date Medicare IM given:   Date Additional Medicare IM given:    Discharge Disposition:    Per UR Regulation:    If discussed at Long Length of Stay Meetings, dates discussed:    Comments:  12/26/11 15:35 Letha Cape RN, BSN [phone] patient is for MRI today, patient will be here a couple of days, he has a picc line already.  Patient most likely will need HHRN for IV ABX.  Patient chose to work with [facility], referral made for [facility] to [facility], Hilda Lias notified.  Soc will begin 24-48 hrs post discharge.  12/25/11- 1200- Donn Pierini RN, BSN [phone] Pt for I&D today per surgery- will need IV abx and wound care at discharge. Will f/u for d/c plan LTAC vs SNF vs HH.

## 2011-12-26 NOTE — Anesthesia Preprocedure Evaluation (Signed)
Anesthesia Evaluation  Patient identified by MRN, date of birth, ID band Patient awake    Reviewed: Allergy & Precautions, H&P , NPO status , Patient's Chart, lab work & pertinent test results, reviewed documented beta blocker date and time   Airway Mallampati: II TM Distance: >3 FB Neck ROM: full    Dental   Pulmonary neg pulmonary ROS,          Cardiovascular negative cardio ROS      Neuro/Psych PSYCHIATRIC DISORDERS negative neurological ROS     GI/Hepatic negative GI ROS, Neg liver ROS,   Endo/Other  negative endocrine ROS  Renal/GU negative Renal ROS  negative genitourinary   Musculoskeletal   Abdominal   Peds  Hematology negative hematology ROS (+)   Anesthesia Other Findings See surgeon's H&P   Reproductive/Obstetrics negative OB ROS                           Anesthesia Physical Anesthesia Plan  ASA: II and Emergent  Anesthesia Plan: General   Post-op Pain Management:    Induction: Intravenous, Rapid sequence and Cricoid pressure planned  Airway Management Planned: Oral ETT  Additional Equipment:   Intra-op Plan:   Post-operative Plan: Extubation in OR  Informed Consent: I have reviewed the patients History and Physical, chart, labs and discussed the procedure including the risks, benefits and alternatives for the proposed anesthesia with the patient or authorized representative who has indicated his/her understanding and acceptance.   Dental advisory given  Plan Discussed with: CRNA and Surgeon  Anesthesia Plan Comments:         Anesthesia Quick Evaluation

## 2011-12-26 NOTE — Consult Note (Signed)
Date of Admission:  12/23/2011  Date of Consult:  12/26/2011  Reason for Consult: hand and arm infection with Group A streptococcus Referring Physician: Dr. Thedore Mins   HPI: Hector Parks is an 36 y.o. male with acute onset of hand pain last Friday and fevers, seen at urgent care and given TMP/smx with worsening swelling and pain to 8/10 fever, maliase and systemic symptoms. He was [facility] on 12/24/11 [facility] and started on vancomycin, clindamycin and then unasyn, changed to merrem yesterday. He has had surgery by Dr. Mina Marble x 2 (last surgery yesterday) with apparent cutlures from OR showing GAS. Despite appropriate surgery on hand he has had worsening erythema extending up his arm to near axilla and this prompted broadening of abx and consult to [place] in ID. He is scheduled for MRI today.   Past Medical History  Diagnosis Date  . Spontaneous pneumothorax     Past Surgical History  Procedure Date  . I&d extremity 12/23/2011    Procedure: IRRIGATION AND DEBRIDEMENT EXTREMITY;  Surgeon: Marlowe Shores, MD;  Location: [facility] OR;  Service: Orthopedics;  Laterality: Right;  ergies:   No Known Allergies   Medications: I have reviewed patients current medications as documented in Epic Anti-infectives     Start     Dose/Rate Route Frequency Ordered Stop   12/25/11 1800   vancomycin (VANCOCIN) 1,500 mg in sodium chloride 0.9 % 500 mL IVPB        1,500 mg 250 mL/hr over 120 Minutes Intravenous Every 12 hours 12/25/11 0446     12/25/11 1800   meropenem (MERREM) 1 g in sodium chloride 0.9 % 100 mL IVPB        1 g 200 mL/hr over 30 Minutes Intravenous 3 times per day 12/25/11 1558     12/24/11 1230   vancomycin (VANCOCIN) 1,500 mg in sodium chloride 0.9 % 500 mL IVPB  Status:  Discontinued        1,500 mg 250 mL/hr over 120 Minutes Intravenous Every 12 hours 12/24/11 1131 12/25/11 0446   12/24/11 1200   ampicillin-sulbactam (UNASYN) 1.5 g in sodium chloride 0.9 % 50 mL IVPB  Status:   Discontinued        1.5 g 100 mL/hr over 30 Minutes Intravenous Every 6 hours 12/24/11 1130 12/25/11 1506   12/23/11 1915   clindamycin (CLEOCIN) IVPB 600 mg        600 mg 100 mL/hr over 30 Minutes Intravenous  Once 12/23/11 1906 12/23/11 2013   12/23/11 1915   vancomycin (VANCOCIN) IVPB 1000 mg/200 mL premix        1,000 mg 200 mL/hr over 60 Minutes Intravenous  Once 12/23/11 1906 12/23/11 2129          Social History:  reports that he has been smoking Cigarettes.  He has been smoking about 1 pack per day. He does not have any smokeless tobacco history on file. He reports that he does not drink alcohol. His drug history not on file.  History reviewed. No pertinent family history.  As in HPI and primary teams notes otherwise 12 point review of systems is negative  Blood pressure 159/82, pulse 75, temperature 98.1 F (36.7 C), temperature source Oral, resp. rate 16, height 5\' 7"  (1.702 m), weight 170 lb (77.111 kg), SpO2 97.00%. General: Alert and awake, oriented x3, not in any acute distress. HEENT: anicteric sclera, pupils reactive to light and accommodation, EOMI, oropharynx clear and without exudate CVS regular rate, normal r,  no  murmur rubs or gallops Chest: clear to auscultation bilaterally, no wheezing, rales or rhonchi Abdomen: soft nontender, nondistended, normal bowel sounds, Extremities:Skin: Right hand bandaged with intense erythema seen proximally extending medially and to near axilla area is exquisitely tender but improved vs yesterday per pt Neuro: nonfocal, strength and sensation intact   Results for orders placed during the hospital encounter of 12/23/11 (from the past 48 hour(s))  CBC     Status: Abnormal   Collection Time   12/25/11  5:15 AM      Component Value Range Comment   WBC 16.6 (*) 4.0 - 10.5 (K/uL)    RBC 4.21 (*) 4.22 - 5.81 (MIL/uL)    Hemoglobin 12.9 (*) 13.0 - 17.0 (g/dL)    HCT 40.9 (*) 81.1 - 52.0 (%)    MCV 87.2  78.0 - 100.0 (fL)    MCH  30.6  26.0 - 34.0 (pg)    MCHC 35.1  30.0 - 36.0 (g/dL)    RDW 91.4  78.2 - 95.6 (%)    Platelets 267  150 - 400 (K/uL)   BASIC METABOLIC PANEL     Status: Abnormal   Collection Time   12/25/11  5:15 AM      Component Value Range Comment   Sodium 136  135 - 145 (mEq/L)    Potassium 3.5  3.5 - 5.1 (mEq/L)    Chloride 99  96 - 112 (mEq/L)    CO2 28  19 - 32 (mEq/L)    Glucose, Bld 99  70 - 99 (mg/dL)    BUN <3 (*) 6 - 23 (mg/dL)    Creatinine, Ser 2.13  0.50 - 1.35 (mg/dL)    Calcium 8.8  8.4 - 10.5 (mg/dL)    GFR calc non Af Amer >90  >90 (mL/min)    GFR calc Af Amer >90  >90 (mL/min)   HIV ANTIBODY (ROUTINE TESTING)     Status: Normal   Collection Time   12/25/11  5:15 AM      Component Value Range Comment   HIV NON REACTIVE  NON REACTIVE    SURGICAL PCR SCREEN     Status: Normal   Collection Time   12/25/11  6:50 AM      Component Value Range Comment   MRSA, PCR NEGATIVE  NEGATIVE     Staphylococcus aureus NEGATIVE  NEGATIVE    GRAM STAIN     Status: Normal   Collection Time   12/25/11  4:58 PM      Component Value Range Comment   Specimen Description ABSCESS HAND RIGHT      Special Requests NONE      Gram Stain        Value: MODERATE WBC PRESENT,BOTH PMN AND MONONUCLEAR     NO ORGANISMS SEEN     Gram Stain Report Called to,Read Back By and Verified With: Mendel Corning RN 17:40 12/25/11 ([email or web address])   Report Status 12/25/2011 FINAL     CULTURE, ROUTINE-ABSCESS     Status: Normal (Preliminary result)   Collection Time   12/25/11  4:58 PM      Component Value Range Comment   Specimen Description ABSCESS HAND RIGHT      Special Requests NONE      Gram Stain        Value: MODERATE WBC PRESENT,BOTH PMN AND MONONUCLEAR     NO ORGANISMS SEEN     Performed at [facility] Gram Stain Report Called to,Read Back By and Verified With:  Gram Stain Report Called to,Read Back By and Verified With: Mendel Corning RN [phone] 12/25/11 WILSONM   Culture NO GROWTH      Report Status PENDING       ANAEROBIC CULTURE     Status: Normal (Preliminary result)   Collection Time   12/25/11  4:58 PM      Component Value Range Comment   Specimen Description ABSCESS HAND RIGHT      Special Requests NONE      Gram Stain        Value: MODERATE WBC PRESENT,BOTH PMN AND MONONUCLEAR     NO ORGANISMS SEEN     Performed at [facility] Gram Stain Report Called to,Read Back By and Verified With: Gram Stain Report Called to,Read Back By and Verified With: Mendel Corning RN [phone] 12/25/11 WILSONM   Culture        Value: NO ANAEROBES ISOLATED; CULTURE IN PROGRESS FOR 5 DAYS   Report Status PENDING     CBC     Status: Abnormal   Collection Time   12/26/11  7:00 AM      Component Value Range Comment   WBC 18.1 (*) 4.0 - 10.5 (K/uL)    RBC 4.28  4.22 - 5.81 (MIL/uL)    Hemoglobin 13.1  13.0 - 17.0 (g/dL)    HCT 30.8 (*) 65.7 - 52.0 (%)    MCV 88.1  78.0 - 100.0 (fL)    MCH 30.6  26.0 - 34.0 (pg)    MCHC 34.7  30.0 - 36.0 (g/dL)    RDW 84.6  96.2 - 95.2 (%)    Platelets 319  150 - 400 (K/uL)   BASIC METABOLIC PANEL     Status: Abnormal   Collection Time   12/26/11  7:00 AM      Component Value Range Comment   Sodium 138  135 - 145 (mEq/L)    Potassium 3.8  3.5 - 5.1 (mEq/L)    Chloride 101  96 - 112 (mEq/L)    CO2 27  19 - 32 (mEq/L)    Glucose, Bld 89  70 - 99 (mg/dL)    BUN 3 (*) 6 - 23 (mg/dL)    Creatinine, Ser 8.41  0.50 - 1.35 (mg/dL)    Calcium 8.9  8.4 - 10.5 (mg/dL)    GFR calc non Af Amer >90  >90 (mL/min)    GFR calc Af Amer >90  >90 (mL/min)       Component Value Date/Time   SDES ABSCESS HAND RIGHT 12/25/2011 1658   SDES ABSCESS HAND RIGHT 12/25/2011 1658   SDES ABSCESS HAND RIGHT 12/25/2011 1658   SPECREQUEST NONE 12/25/2011 1658   SPECREQUEST NONE 12/25/2011 1658   SPECREQUEST NONE 12/25/2011 1658   CULT NO GROWTH 12/25/2011 1658   CULT NO ANAEROBES ISOLATED; CULTURE IN PROGRESS FOR 5 DAYS 12/25/2011 1658   REPTSTATUS 12/25/2011 FINAL 12/25/2011 1658   REPTSTATUS PENDING  12/25/2011 1658   REPTSTATUS PENDING 12/25/2011 1658   No results found.   Recent Results (from the past 720 hour(s))  CULTURE, BLOOD (ROUTINE X 2)     Status: Normal (Preliminary result)   Collection Time   12/23/11  7:25 PM      Component Value Range Status Comment   Specimen Description BLOOD HAND LEFT   Final    Special Requests BOTTLES DRAWN AEROBIC ONLY [facility]   Final    Culture  Setup Time [id number]   Final    Culture  Final    Value:        BLOOD CULTURE RECEIVED NO GROWTH TO DATE CULTURE WILL BE HELD FOR 5 DAYS BEFORE ISSUING A FINAL NEGATIVE REPORT   Report Status PENDING   Incomplete   CULTURE, BLOOD (ROUTINE X 2)     Status: Normal (Preliminary result)   Collection Time   12/23/11  7:36 PM      Component Value Range Status Comment   Specimen Description BLOOD ARM LEFT   Final    Special Requests     Final    Value: BOTTLES DRAWN AEROBIC AND ANAEROBIC AERO 10CC, ANAE 8CC   Culture  Setup Time [id number]   Final    Culture     Final    Value:        BLOOD CULTURE RECEIVED NO GROWTH TO DATE CULTURE WILL BE HELD FOR 5 DAYS BEFORE ISSUING A FINAL NEGATIVE REPORT   Report Status PENDING   Incomplete   CULTURE, ROUTINE-ABSCESS     Status: Normal   Collection Time   12/23/11  9:56 PM      Component Value Range Status Comment   Specimen Description ABSCESS HAND RIGHT   Final    Special Requests PT ON BACTRUM,CLEOCIN,VANC   Final    Gram Stain     Final    Value: RARE WBC PRESENT,BOTH PMN AND MONONUCLEAR     NO ORGANISMS SEEN     Performed at [facility]   Culture FEW GROUP A STREP (S.PYOGENES) ISOLATED   Final    Report Status 12/26/2011 FINAL   Final   ANAEROBIC CULTURE     Status: Normal (Preliminary result)   Collection Time   12/23/11  9:56 PM      Component Value Range Status Comment   Specimen Description ABSCESS HAND RIGHT   Final    Special Requests PT ON BACTRUM,CLEOCIN,VANC   Final    Gram Stain PENDING   Incomplete    Culture     Final    Value: NO  ANAEROBES ISOLATED; CULTURE IN PROGRESS FOR 5 DAYS   Report Status PENDING   Incomplete   GRAM STAIN     Status: Normal   Collection Time   12/23/11  9:56 PM      Component Value Range Status Comment   Specimen Description ABSCESS HAND RIGHT   Final    Special Requests PT ON BACTRUM,CLEOCIN,VANC   Final    Gram Stain     Final    Value: RARE WBC PRESENT,BOTH PMN AND MONONUCLEAR     NO ORGANISMS SEEN   Report Status 12/23/2011 FINAL   Final   SURGICAL PCR SCREEN     Status: Normal   Collection Time   12/25/11  6:50 AM      Component Value Range Status Comment   MRSA, PCR NEGATIVE  NEGATIVE  Final    Staphylococcus aureus NEGATIVE  NEGATIVE  Final   GRAM STAIN     Status: Normal   Collection Time   12/25/11  4:58 PM      Component Value Range Status Comment   Specimen Description ABSCESS HAND RIGHT   Final    Special Requests NONE   Final    Gram Stain     Final    Value: MODERATE WBC PRESENT,BOTH PMN AND MONONUCLEAR     NO ORGANISMS SEEN     Gram Stain Report Called to,Read Back By and Verified With: Mendel Corning RN 17:40 12/25/11 (  [email or web address])   Report Status 12/25/2011 FINAL   Final   CULTURE, ROUTINE-ABSCESS     Status: Normal (Preliminary result)   Collection Time   12/25/11  4:58 PM      Component Value Range Status Comment   Specimen Description ABSCESS HAND RIGHT   Final    Special Requests NONE   Final    Gram Stain     Final    Value: MODERATE WBC PRESENT,BOTH PMN AND MONONUCLEAR     NO ORGANISMS SEEN     Performed at [facility] Gram Stain Report Called to,Read Back By and Verified With: Gram Stain Report Called to,Read Back By and Verified With: Mendel Corning RN [phone] 12/25/11 WILSONM   Culture NO GROWTH   Final    Report Status PENDING   Incomplete   ANAEROBIC CULTURE     Status: Normal (Preliminary result)   Collection Time   12/25/11  4:58 PM      Component Value Range Status Comment   Specimen Description ABSCESS HAND RIGHT   Final    Special Requests NONE   Final      Gram Stain     Final    Value: MODERATE WBC PRESENT,BOTH PMN AND MONONUCLEAR     NO ORGANISMS SEEN     Performed at [facility] Gram Stain Report Called to,Read Back By and Verified With: Gram Stain Report Called to,Read Back By and Verified With: Mendel Corning RN [phone] 12/25/11 WILSONM   Culture     Final    Value: NO ANAEROBES ISOLATED; CULTURE IN PROGRESS FOR 5 DAYS   Report Status PENDING   Incomplete      Impression/Recommendation  36 year old with complicated hand abscess with GAS and now worsening erythema going up arm yesterday on broad spectrum vancomycin, merrem  1) GAS infection: Dont think he needs abx as broad as he is getting but understand given clinical progression --agree with MRI and with this type of infection will need prompt surgical attention to any abscess identified or anything that resembles necrotizing fascitis --add clindamycin  2) Screening: check HIV   Thank you so much for this interesting consult,   Acey Lav 12/26/2011, 2:22 PM   [phone] (pager) [phone] (office)

## 2011-12-26 NOTE — Op Note (Signed)
NAME:  Wurth, Ahren                    ACCOUNT NO.:  MEDICAL RECORD NO.:  [id number]  LOCATION:                                 FACILITY:  PHYSICIAN:  Michio A. Coraline Talwar, M.D.DATE OF BIRTH:  December 26, 1975  DATE OF PROCEDURE:  12/25/2011 DATE OF DISCHARGE:                              OPERATIVE REPORT   PREOPERATIVE DIAGNOSIS:  Infection dorsal aspect of right hand.  POSTOPERATIVE DIAGNOSIS:  Infection dorsal aspect of right hand.  PROCEDURE:  Repeat I and D, and secondary wound closure over Penrose drain.  SURGEON:  Artist Pais. Mina Marble, MD  ASSISTANT:  None.  ANESTHESIA:  General.  TOURNIQUET TIME:  Noted in Epic.  COMPLICATIONS:  No complications.  DRAINS:  No drains.  CULTURES:  Cultures were sent.  DESCRIPTION OF PROCEDURE:  The patient was taken to the operating suite. After induction of adequate general anesthesia, the right upper extremity was prepped and draped in sterile fashion.  The previously placed packing was removed.  The dorsal and volar wounds were cultured. There was no evidence for reaccumulation of purulence.  There was good granulation tissue and bleeding tissue.  No signs of infection.  The wounds dorsally and volarly were explored.  The fascia overlying the metacarpal was opened.  Interosseous muscle was intact and viable.  No signs of infection.  Volarly, antebrachial fascia was opened.  Flexor tendons were inspected and no purulence or evidence of infection.  These wounds both thoroughly irrigated and loosely closed with 1 simple 3-0 nylon volarly and 3 vertical mattress sutures over a Penrose drain dorsally.  The patient was then placed in sterile dressing, 4x4s, fluffs, and a compression wrap.  The patient tolerated the procedure well and went to the recovery room in stable fashion.     Artist Pais Mina Marble, M.D.     MAW/MEDQ  D:  12/25/2011  T:  12/25/2011  Job:  [id number]

## 2011-12-26 NOTE — Anesthesia Postprocedure Evaluation (Signed)
Anesthesia Post Note  Patient: Hector Parks  Procedure(s) Performed: Procedure(s) (LRB): IRRIGATION AND DEBRIDEMENT EXTREMITY (Right)  Anesthesia type: General  Patient location: PACU  Post pain: Pain level controlled and Adequate analgesia  Post assessment: Post-op Vital signs reviewed, Patient's Cardiovascular Status Stable, Respiratory Function Stable, Patent Airway and Pain level controlled  Last Vitals:  Filed Vitals:   12/26/11 0521  BP: 125/74  Pulse: 78  Temp: 37 C  Resp: 13    Post vital signs: Reviewed and stable  Level of consciousness: awake, alert  and oriented  Complications: No apparent anesthesia complications

## 2011-12-26 NOTE — Progress Notes (Signed)
Patient Demographics  Hector Parks, is a 36 y.o. male  ZOX:[id number]  WJX:[id number]  DOB - 12-13-1975  12/23/2011  Admitting Physician Rometta Emery, MD  Outpatient Primary MD for the patient is DEFAULT,PROVIDER, MD, MD  LOS 2     Chief Complaint  Patient presents with  . Cellulitis        Subjective:   Hector Parks today has, No headache, No chest pain, No abdominal pain - No Nausea, No new weakness tingling or numbness, No Cough - SOB.Rt hand pain.  Objective:   Filed Vitals:   12/26/11 0345 12/26/11 0521 12/26/11 0811 12/26/11 1033  BP:  125/74  148/82  Pulse:  78  64  Temp:  98.6 F (37 C)  98.3 F (36.8 C)  TempSrc:  Oral    Resp: 15 13 14 18   Height:      Weight:      SpO2: 96% 100% 97% 96%    Wt Readings from Last 3 Encounters:  12/25/11 77.111 kg (170 lb)  12/25/11 77.111 kg (170 lb)  12/25/11 77.111 kg (170 lb)     Intake/Output Summary (Last 24 hours) at 12/26/11 1143 Last data filed at 12/26/11 0900  Gross per 24 hour  Intake 4575.4 ml  Output    795 ml  Net 3780.4 ml    Exam Awake Alert, Oriented *3, No new F.N deficits, Normal affect [place].AT,PERRAL Supple Neck,No JVD, No cervical lymphadenopathy appriciated.  Symmetrical Chest wall movement, Good air movement bilaterally, CTAB RRR,No Gallops,Rubs or new Murmurs, No Parasternal Heave +ve B.Sounds, Abd Soft, Non tender, No organomegaly appriciated, No rebound -guarding or rigidity. No Cyanosis, Clubbing or edema, No new Rash or bruise, except Rt hand in bandage, redness and swelling up to axilla.  Data Review  CBC  Lab 12/26/11 0700 12/25/11 0515 12/24/11 0700 12/24/11 0113 12/23/11 1919  WBC 18.1* 16.6* 18.7* 19.2* 21.7*  HGB 13.1 12.9* 12.6* 13.1 14.6  HCT 37.7* 36.7* 35.8* 36.3* 40.1  PLT 319 267 210 211 211  MCV 88.1 87.2 86.1 86.2 85.9  MCH 30.6 30.6 30.3 31.1 31.3  MCHC 34.7 35.1 35.2 36.1* 36.4*  RDW 15.1 14.8 14.5 14.3 14.2  LYMPHSABS -- -- -- 2.5 1.8  MONOABS -- --  -- 1.2* 2.0*  EOSABS -- -- -- 0.1 0.0  BASOSABS -- -- -- 0.0 0.0  BANDABS -- -- -- -- --    Chemistries   Lab 12/26/11 0700 12/25/11 0515 12/24/11 0700 12/23/11 1919  NA 138 136 132* 132*  K 3.8 3.5 3.0* 2.9*  CL 101 99 95* 93*  CO2 27 28 26 26   GLUCOSE 89 99 94 108*  BUN 3* <3* 6 10  CREATININE 0.63 0.66 0.67 0.76  CALCIUM 8.9 8.8 8.5 9.3  MG -- -- -- --  AST -- -- 18 18  ALT -- -- 25 31  ALKPHOS -- -- 115 104  BILITOT -- -- 0.2* 0.4   ------------------------------------------------------------------------------------------------------------------ estimated creatinine clearance is 118.2 ml/min (by C-G formula based on Cr of 0.63). ------------------------------------------------------------------------------------------------------------------ No results found for this basename: HGBA1C:2 in the last 72 hours ------------------------------------------------------------------------------------------------------------------ No results found for this basename: CHOL:2,HDL:2,LDLCALC:2,TRIG:2,CHOLHDL:2,LDLDIRECT:2 in the last 72 hours ------------------------------------------------------------------------------------------------------------------ No results found for this basename: TSH,T4TOTAL,FREET3,T3FREE,THYROIDAB in the last 72 hours ------------------------------------------------------------------------------------------------------------------ No results found for this basename: VITAMINB12:2,FOLATE:2,FERRITIN:2,TIBC:2,IRON:2,RETICCTPCT:2 in the last 72 hours  Coagulation profile No results found for this basename: INR:5,PROTIME:5 in the last 168 hours  No results found for this basename: DDIMER:2 in the last 72 hours  Cardiac Enzymes No results found for this basename: CK:3,CKMB:3,TROPONINI:3,MYOGLOBIN:3 in the last 168 hours ------------------------------------------------------------------------------------------------------------------ No components found with this  basename: POCBNP:3  Micro Results Recent Results (from the past 240 hour(s))  CULTURE, BLOOD (ROUTINE X 2)     Status: Normal (Preliminary result)   Collection Time   12/23/11  7:25 PM      Component Value Range Status Comment   Specimen Description BLOOD HAND LEFT   Final    Special Requests BOTTLES DRAWN AEROBIC ONLY 5CC   Final    Culture  Setup Time [id number]   Final    Culture     Final    Value:        BLOOD CULTURE RECEIVED NO GROWTH TO DATE CULTURE WILL BE HELD FOR 5 DAYS BEFORE ISSUING A FINAL NEGATIVE REPORT   Report Status PENDING   Incomplete   CULTURE, BLOOD (ROUTINE X 2)     Status: Normal (Preliminary result)   Collection Time   12/23/11  7:36 PM      Component Value Range Status Comment   Specimen Description BLOOD ARM LEFT   Final    Special Requests     Final    Value: BOTTLES DRAWN AEROBIC AND ANAEROBIC AERO 10CC, ANAE 8CC   Culture  Setup Time [id number]   Final    Culture     Final    Value:        BLOOD CULTURE RECEIVED NO GROWTH TO DATE CULTURE WILL BE HELD FOR 5 DAYS BEFORE ISSUING A FINAL NEGATIVE REPORT   Report Status PENDING   Incomplete   CULTURE, ROUTINE-ABSCESS     Status: Normal   Collection Time   12/23/11  9:56 PM      Component Value Range Status Comment   Specimen Description ABSCESS HAND RIGHT   Final    Special Requests PT ON BACTRUM,CLEOCIN,VANC   Final    Gram Stain     Final    Value: RARE WBC PRESENT,BOTH PMN AND MONONUCLEAR     NO ORGANISMS SEEN     Performed at [facility]   Culture FEW GROUP A STREP (S.PYOGENES) ISOLATED   Final    Report Status 12/26/2011 FINAL   Final   ANAEROBIC CULTURE     Status: Normal (Preliminary result)   Collection Time   12/23/11  9:56 PM      Component Value Range Status Comment   Specimen Description ABSCESS HAND RIGHT   Final    Special Requests PT ON BACTRUM,CLEOCIN,VANC   Final    Gram Stain PENDING   Incomplete    Culture     Final    Value: NO ANAEROBES ISOLATED; CULTURE IN PROGRESS  FOR 5 DAYS   Report Status PENDING   Incomplete   GRAM STAIN     Status: Normal   Collection Time   12/23/11  9:56 PM      Component Value Range Status Comment   Specimen Description ABSCESS HAND RIGHT   Final    Special Requests PT ON BACTRUM,CLEOCIN,VANC   Final    Gram Stain     Final    Value: RARE WBC PRESENT,BOTH PMN AND MONONUCLEAR     NO ORGANISMS SEEN   Report Status 12/23/2011 FINAL   Final   SURGICAL PCR SCREEN     Status: Normal   Collection Time   12/25/11  6:50 AM      Component Value Range Status Comment   MRSA, PCR NEGATIVE  NEGATIVE  Final    Staphylococcus aureus NEGATIVE  NEGATIVE  Final   GRAM STAIN     Status: Normal   Collection Time   12/25/11  4:58 PM      Component Value Range Status Comment   Specimen Description ABSCESS HAND RIGHT   Final    Special Requests NONE   Final    Gram Stain     Final    Value: MODERATE WBC PRESENT,BOTH PMN AND MONONUCLEAR     NO ORGANISMS SEEN     Gram Stain Report Called to,Read Back By and Verified With: Mendel Corning RN 17:40 12/25/11 ([email or web address])   Report Status 12/25/2011 FINAL   Final   CULTURE, ROUTINE-ABSCESS     Status: Normal (Preliminary result)   Collection Time   12/25/11  4:58 PM      Component Value Range Status Comment   Specimen Description ABSCESS HAND RIGHT   Final    Special Requests NONE   Final    Gram Stain     Final    Value: MODERATE WBC PRESENT,BOTH PMN AND MONONUCLEAR     NO ORGANISMS SEEN     Performed at [facility] Gram Stain Report Called to,Read Back By and Verified With: Gram Stain Report Called to,Read Back By and Verified With: Mendel Corning RN [phone] 12/25/11 WILSONM   Culture NO GROWTH   Final    Report Status PENDING   Incomplete   ANAEROBIC CULTURE     Status: Normal (Preliminary result)   Collection Time   12/25/11  4:58 PM      Component Value Range Status Comment   Specimen Description ABSCESS HAND RIGHT   Final    Special Requests NONE   Final    Gram Stain     Final    Value:  MODERATE WBC PRESENT,BOTH PMN AND MONONUCLEAR     NO ORGANISMS SEEN     Performed at [facility] Gram Stain Report Called to,Read Back By and Verified With: Gram Stain Report Called to,Read Back By and Verified With: Mendel Corning RN [phone] 12/25/11 WILSONM   Culture PENDING   Incomplete    Report Status PENDING   Incomplete     Radiology Reports Dg Forearm Right  12/23/2011  *RADIOLOGY REPORT*  Clinical Data: Pain, redness and swelling of the right hand and forearm for the past 4 days.  RIGHT FOREARM - 2 VIEW  Comparison: No priors.  Findings: AP and lateral radiographs of the right forearm demonstrate no acute fracture or retained radiopaque foreign body. Overlying soft tissues appear mildly swollen.  IMPRESSION: 1.  No acute radiographic abnormality of the right forearm. Specifically, no retained radiopaque foreign body identified.  Original Report Authenticated By: Florencia Reasons, M.D.   Dg Hand Complete Right  12/23/2011  *RADIOLOGY REPORT*  Clinical Data: Pain and swelling in the right hand and forearm for the past 4 days, getting worse.  RIGHT HAND - COMPLETE 3+ VIEW  Comparison: No priors.  Findings: Multiple views of the right hand demonstrate no acute fracture, subluxation, dislocation, retained radiopaque foreign body or joint abnormality.  There is profound soft tissue swelling in the right hand, particularly overlying the region of the metacarpals.  IMPRESSION: 1.  Profound soft tissue swelling of the right hand overlying the metacarpals.  No unexpected retained radiopaque foreign body or underlying bony abnormality.  Original Report Authenticated By: Florencia Reasons, M.D.    Scheduled Meds:    . enoxaparin  30  mg Subcutaneous Q12H  . escitalopram  20 mg Oral Daily  . HYDROmorphone      . HYDROmorphone PCA 0.3 mg/mL   Intravenous Q4H  . meropenem (MERREM) IV  1 g Intravenous Q8H  . potassium chloride  20 mEq Oral Once  . vancomycin  1,500 mg Intravenous Q12H  . DISCONTD:  ampicillin-sulbactam (UNASYN) IV  1.5 g Intravenous Q6H  . DISCONTD: enoxaparin  30 mg Subcutaneous BID  . DISCONTD: enoxaparin  30 mg Subcutaneous Q12H  . DISCONTD: potassium chloride  40 mEq Oral Once   Continuous Infusions:    . 0.9 % NaCl with KCl 40 mEq / L 75 mL/hr at 12/25/11 1948  . lactated ringers 50 mL/hr at 12/25/11 1625  . DISCONTD: 0.9 % NaCl with KCl 40 mEq / L 100 mL/hr at 12/25/11 0611   PRN Meds:.acetaminophen, acetaminophen, ALPRAZolam, diphenhydrAMINE, diphenhydrAMINE, naloxone, ondansetron (ZOFRAN) IV, oxyCODONE, sodium chloride, sodium chloride, DISCONTD: HYDROmorphone, DISCONTD: ondansetron (ZOFRAN) IV, DISCONTD: sodium chloride irrigation  Assessment & Plan   1. R. Cellulitis and abscess with Leukocytosis- hand surgery following I&D on 4-22 and again on 4-24, IV Vanco continue change Unasyn to Meropenam for psedo coverage, pt denies any IV drug use asked repeatedly, ID called, will get MRI Arm today, ID on case.   2. Hypokalemia - better, replaced, monitor.   3.Depression - home meds.    DVT Prophylaxis  Lovenox       Leroy Sea M.D on 12/26/2011 at 11:43 AM  [facility] Office  [phone]

## 2011-12-26 NOTE — Progress Notes (Signed)
Clinical Social Work  CSW received inappropriate referral for SNF placement. CSW is signing off but available if needed.  [place], [place] [phone] (Coverage)

## 2011-12-26 NOTE — Progress Notes (Signed)
Subjective: Day of Surgery Procedure(s) (LRB): IRRIGATION AND DEBRIDEMENT EXTREMITY (Right) Patient reports pain as severe.  Pain in medial side of elbow/upper arm.  Does not feel ill, feverish, nauseated.  No issues outside of right arm.  Objective: Vital signs in last 24 hours: Temp:  [98.1 F (36.7 C)-99.2 F (37.3 C)] 98.6 F (37 C) (04/25 2241) Pulse Rate:  [64-83] 66  (04/25 2241) Resp:  [13-18] 18  (04/25 2241) BP: (125-159)/(74-84) 153/80 mmHg (04/25 2241) SpO2:  [96 %-100 %] 100 % (04/25 2241) Weight:  [77.111 kg (170 lb)] 77.111 kg (170 lb) (04/24 2351)  Intake/Output from previous day: 04/24 0701 - 04/25 0700 In: 4095.4 [P.O.:720; I.V.:2375.4; IV Piggyback:1000] Out: 795 [Urine:775; Blood:20] Intake/Output this shift: Total I/O In: 1221 [I.V.:371; IV Piggyback:850] Out: 850 [Urine:850]   Basename 12/26/11 0700 12/25/11 0515 12/24/11 0700 12/24/11 0113  HGB 13.1 12.9* 12.6* 13.1    Basename 12/26/11 0700 12/25/11 0515  WBC 18.1* 16.6*  RBC 4.28 4.21*  HCT 37.7* 36.7*  PLT 319 267    Basename 12/26/11 0700 12/25/11 0515  NA 138 136  K 3.8 3.5  CL 101 99  CO2 27 28  BUN 3* <3*  CREATININE 0.63 0.66  GLUCOSE 89 99  CALCIUM 8.9 8.8   No results found for this basename: LABPT:2,INR:2 in the last 72 hours  light touch sensation and capillary refill intact all digits.  +epl/fpl/io.  erythema on medial side of elbow/arm.  tender to palpation in this area.  area has a boggy feel to it.  not as tender elsewhere.  Assessment/Plan: Day of Surgery Procedure(s) (LRB): IRRIGATION AND DEBRIDEMENT EXTREMITY (Right) MRI reviewed and shows edema in medial subcutaneous tissues at elbow.  Discussed condition at length with patient.  Recommend return to OR for I&D of medial side of arm at elbow given continued high wbc, pain and erythema in area.  Risks, benefits, and alternatives of surgery were discussed and the patient agrees with the plan of care.   Hector Parks  R 12/26/2011, 11:19 PM

## 2011-12-26 NOTE — Anesthesia Procedure Notes (Signed)
Procedure Name: Intubation Date/Time: 12/26/2011 11:33 PM Performed by: Molli Hazard Pre-anesthesia Checklist: Patient identified, Emergency Drugs available, Suction available and Patient being monitored Patient Re-evaluated:Patient Re-evaluated prior to inductionOxygen Delivery Method: Circle system utilized Preoxygenation: Pre-oxygenation with 100% oxygen Intubation Type: IV induction, Rapid sequence and Cricoid Pressure applied Laryngoscope Size: Miller and 2 Grade View: Grade I Tube type: Oral Tube size: 7.5 mm Number of attempts: 1 Airway Equipment and Method: Stylet Placement Confirmation: ETT inserted through vocal cords under direct vision,  positive ETCO2 and breath sounds checked- equal and bilateral Secured at: 22 cm Tube secured with: Tape Dental Injury: Teeth and Oropharynx as per pre-operative assessment

## 2011-12-27 ENCOUNTER — Encounter (HOSPITAL_COMMUNITY): Payer: Self-pay | Admitting: Orthopedic Surgery

## 2011-12-27 DIAGNOSIS — L0291 Cutaneous abscess, unspecified: Secondary | ICD-10-CM

## 2011-12-27 DIAGNOSIS — L0390 Cellulitis, unspecified: Secondary | ICD-10-CM

## 2011-12-27 LAB — GRAM STAIN

## 2011-12-27 LAB — BASIC METABOLIC PANEL
CO2: 30 mEq/L (ref 19–32)
Calcium: 8.9 mg/dL (ref 8.4–10.5)
GFR calc Af Amer: >90 mL/min (ref >90)
GFR calc non Af Amer: >90 mL/min (ref >90)
Sodium: 139 mEq/L (ref 135–145)

## 2011-12-27 LAB — CBC
MCH: 30.3 pg (ref 26.0–34.0)
MCHC: 34.3 g/dL (ref 30.0–36.0)
Platelets: 376 10*3/uL (ref 150–400)
RBC: 4.09 MIL/uL — ABNORMAL LOW (ref 4.22–5.81)
RDW: 15.2 % (ref 11.5–15.5)

## 2011-12-27 LAB — HIV ANTIBODY (ROUTINE TESTING W REFLEX): HIV: NONREACTIVE

## 2011-12-27 MED ORDER — HYDROMORPHONE HCL PF 1 MG/ML IJ SOLN: 0.2500 mg | INTRAMUSCULAR | Status: DC | PRN

## 2011-12-27 MED ORDER — DROPERIDOL 2.5 MG/ML IJ SOLN
INTRAMUSCULAR | Status: DC | PRN
  Administered 2011-12-27: 0.625 mg via INTRAVENOUS

## 2011-12-27 MED ORDER — METOCLOPRAMIDE HCL 5 MG/ML IJ SOLN
INTRAMUSCULAR | Status: DC | PRN
  Administered 2011-12-27: 10 mg via INTRAVENOUS

## 2011-12-27 MED ORDER — ONDANSETRON HCL 4 MG/2ML IJ SOLN
INTRAMUSCULAR | Status: DC | PRN
  Administered 2011-12-27: 4 mg via INTRAVENOUS

## 2011-12-27 MED ORDER — BUPIVACAINE HCL (PF) 0.25 % IJ SOLN
INTRAMUSCULAR | Status: DC | PRN
  Administered 2011-12-27: 20 mL

## 2011-12-27 MED ORDER — CLONIDINE HCL 0.2 MG PO TABS
0.2000 mg | ORAL_TABLET | Freq: Three times a day (TID) | ORAL | Status: DC
  Administered 2011-12-27 – 2011-12-29 (×7): 0.2 mg via ORAL
  Filled 2011-12-27 (×9): qty 1

## 2011-12-27 MED ORDER — SODIUM CHLORIDE 0.9 % IV SOLN
3.0000 g | Freq: Four times a day (QID) | INTRAVENOUS | Status: DC
  Administered 2011-12-27 – 2011-12-29 (×9): 3 g via INTRAVENOUS
  Filled 2011-12-27 (×12): qty 3

## 2011-12-27 MED ORDER — POTASSIUM CHLORIDE CRYS ER 20 MEQ PO TBCR
40.0000 meq | EXTENDED_RELEASE_TABLET | Freq: Once | ORAL | Status: AC
  Administered 2011-12-27: 40 meq via ORAL
  Filled 2011-12-27: qty 2

## 2011-12-27 MED ORDER — SODIUM CHLORIDE 0.9 % IV SOLN
3.0000 g | Freq: Three times a day (TID) | INTRAVENOUS | Status: DC
  Filled 2011-12-27 (×2): qty 3

## 2011-12-27 MED ORDER — SODIUM CHLORIDE 0.9 % IR SOLN
Status: DC | PRN
  Administered 2011-12-27 (×2): 3000 mL

## 2011-12-27 MED ORDER — MORPHINE SULFATE 2 MG/ML IJ SOLN
2.0000 mg | INTRAMUSCULAR | Status: DC | PRN
  Administered 2011-12-28 – 2011-12-29 (×7): 2 mg via INTRAVENOUS
  Filled 2011-12-27 (×7): qty 1

## 2011-12-27 MED ORDER — METOCLOPRAMIDE HCL 5 MG/ML IJ SOLN
10.0000 mg | Freq: Once | INTRAMUSCULAR | Status: AC | PRN
  Filled 2011-12-27: qty 2

## 2011-12-27 NOTE — Op Note (Signed)
Dictation [phone]

## 2011-12-27 NOTE — Progress Notes (Addendum)
ANTIBIOTIC CONSULT NOTE   Pharmacy Consult for unasyn Indication: cellulitis, abscess right hand  No Known Allergies  Patient Measurements: Height: 5\' 7"  (170.2 cm) Weight: 170 lb (77.111 kg) IBW/kg (Calculated) : 66.1  Patient weight ~170#  Vital Signs: Temp: 97.6 F (36.4 C) (04/26 0527) Temp src: Oral (04/26 0527) BP: 181/70 mmHg (04/26 0527) Pulse Rate: 70  (04/26 0527) Intake/Output from previous day: 04/25 0701 - 04/26 0700 In: 4591 [P.O.:1280; I.V.:2311; IV Piggyback:1000] Out: 2550 [Urine:2525; Blood:25] Intake/Output from this shift:    Labs:  [facility] 12/27/11 0604 12/26/11 0700 12/25/11 0515  WBC 11.4* 18.1* 16.6*  HGB 12.4* 13.1 12.9*  PLT 376 319 267  LABCREA -- -- --  CREATININE 0.60 0.63 0.66   Estimated Creatinine Clearance: 118.2 ml/min (by C-G formula based on Cr of 0.6). No results found for this basename: VANCOTROUGH:2,VANCOPEAK:2,VANCORANDOM:2,GENTTROUGH:2,GENTPEAK:2,GENTRANDOM:2,TOBRATROUGH:2,TOBRAPEAK:2,TOBRARND:2,AMIKACINPEAK:2,AMIKACINTROU:2,AMIKACIN:2, in the last 72 hours   Microbiology: Recent Results (from the past 720 hour(s))  CULTURE, BLOOD (ROUTINE X 2)     Status: Normal (Preliminary result)   Collection Time   12/23/11  7:25 PM      Component Value Range Status Comment   Specimen Description BLOOD HAND LEFT   Final    Special Requests BOTTLES DRAWN AEROBIC ONLY 5CC   Final    Culture  Setup Time [id number]   Final    Culture     Final    Value:        BLOOD CULTURE RECEIVED NO GROWTH TO DATE CULTURE WILL BE HELD FOR 5 DAYS BEFORE ISSUING A FINAL NEGATIVE REPORT   Report Status PENDING   Incomplete   CULTURE, BLOOD (ROUTINE X 2)     Status: Normal (Preliminary result)   Collection Time   12/23/11  7:36 PM      Component Value Range Status Comment   Specimen Description BLOOD ARM LEFT   Final    Special Requests     Final    Value: BOTTLES DRAWN AEROBIC AND ANAEROBIC AERO 10CC, ANAE 8CC   Culture  Setup Time [id number]    Final    Culture     Final    Value:        BLOOD CULTURE RECEIVED NO GROWTH TO DATE CULTURE WILL BE HELD FOR 5 DAYS BEFORE ISSUING A FINAL NEGATIVE REPORT   Report Status PENDING   Incomplete   CULTURE, ROUTINE-ABSCESS     Status: Normal   Collection Time   12/23/11  9:56 PM      Component Value Range Status Comment   Specimen Description ABSCESS HAND RIGHT   Final    Special Requests PT ON BACTRUM,CLEOCIN,VANC   Final    Gram Stain     Final    Value: RARE WBC PRESENT,BOTH PMN AND MONONUCLEAR     NO ORGANISMS SEEN     Performed at [facility]   Culture FEW GROUP A STREP (S.PYOGENES) ISOLATED   Final    Report Status 12/26/2011 FINAL   Final   ANAEROBIC CULTURE     Status: Normal (Preliminary result)   Collection Time   12/23/11  9:56 PM      Component Value Range Status Comment   Specimen Description ABSCESS HAND RIGHT   Final    Special Requests PT ON BACTRUM,CLEOCIN,VANC   Final    Gram Stain PENDING   Incomplete    Culture     Final    Value: NO ANAEROBES ISOLATED; CULTURE IN PROGRESS FOR 5 DAYS  Report Status PENDING   Incomplete   GRAM STAIN     Status: Normal   Collection Time   12/23/11  9:56 PM      Component Value Range Status Comment   Specimen Description ABSCESS HAND RIGHT   Final    Special Requests PT ON BACTRUM,CLEOCIN,VANC   Final    Gram Stain     Final    Value: RARE WBC PRESENT,BOTH PMN AND MONONUCLEAR     NO ORGANISMS SEEN   Report Status 12/23/2011 FINAL   Final   SURGICAL PCR SCREEN     Status: Normal   Collection Time   12/25/11  6:50 AM      Component Value Range Status Comment   MRSA, PCR NEGATIVE  NEGATIVE  Final    Staphylococcus aureus NEGATIVE  NEGATIVE  Final   GRAM STAIN     Status: Normal   Collection Time   12/25/11  4:58 PM      Component Value Range Status Comment   Specimen Description ABSCESS HAND RIGHT   Final    Special Requests NONE   Final    Gram Stain     Final    Value: MODERATE WBC PRESENT,BOTH PMN AND MONONUCLEAR      NO ORGANISMS SEEN     Gram Stain Report Called to,Read Back By and Verified With: Mendel Corning RN 17:40 12/25/11 ([email or web address])   Report Status 12/25/2011 FINAL   Final   CULTURE, ROUTINE-ABSCESS     Status: Normal (Preliminary result)   Collection Time   12/25/11  4:58 PM      Component Value Range Status Comment   Specimen Description ABSCESS HAND RIGHT   Final    Special Requests NONE   Final    Gram Stain     Final    Value: MODERATE WBC PRESENT,BOTH PMN AND MONONUCLEAR     NO ORGANISMS SEEN     Performed at [facility] Gram Stain Report Called to,Read Back By and Verified With: Gram Stain Report Called to,Read Back By and Verified With: Mendel Corning RN [phone] 12/25/11 WILSONM   Culture NO GROWTH 1 DAY   Final    Report Status PENDING   Incomplete   ANAEROBIC CULTURE     Status: Normal (Preliminary result)   Collection Time   12/25/11  4:58 PM      Component Value Range Status Comment   Specimen Description ABSCESS HAND RIGHT   Final    Special Requests NONE   Final    Gram Stain     Final    Value: MODERATE WBC PRESENT,BOTH PMN AND MONONUCLEAR     NO ORGANISMS SEEN     Performed at [facility] Gram Stain Report Called to,Read Back By and Verified With: Gram Stain Report Called to,Read Back By and Verified With: Mendel Corning RN [phone] 12/25/11 WILSONM   Culture     Final    Value: NO ANAEROBES ISOLATED; CULTURE IN PROGRESS FOR 5 DAYS   Report Status PENDING   Incomplete   GRAM STAIN     Status: Normal   Collection Time   12/27/11 12:41 AM      Component Value Range Status Comment   Specimen Description ABSCESS ARM RIGHT   Final    Special Requests NONE   Final    Gram Stain     Final    Value: NO ORGANISMS SEEN     NO WBC SEEN   Report Status 12/27/2011  FINAL   Final     Medical History: Past Medical History  Diagnosis Date  . Spontaneous pneumothorax     Medications:  Prescriptions prior to admission  Medication Sig Dispense Refill  . escitalopram (LEXAPRO) 20 MG  tablet Take 20 mg by mouth daily.      Marland Kitchen sulfamethoxazole-trimethoprim (BACTRIM DS) 800-160 MG per tablet Take 1 tablet by mouth 2 (two) times daily.       Assessment: 36 yo man presents with right hand swelling.  Denies trauma or insect bite to hand.  Went to Urgent Care and was given Rx for septra but pain and swelling persisted so he came to ER.  I&D right hand 4/22, 4.24.  I&D right elbow 4/26.  Antibiotics changed clinda and unasyn on 4/26  Goal of Therapy:  Eradication of infection  Plan:  Unasyn 3gm IV q6 hours.  Talbert Cage Poteet 12/27/2011,9:05 AM

## 2011-12-27 NOTE — Progress Notes (Signed)
Subjective: No new complaints   Antibiotics:  Anti-infectives     Start     Dose/Rate Route Frequency Ordered Stop   12/27/11 1000   Ampicillin-Sulbactam (UNASYN) 3 g in sodium chloride 0.9 % 100 mL IVPB  Status:  Discontinued        3 g 100 mL/hr over 60 Minutes Intravenous Every 8 hours 12/27/11 0957 12/27/11 0957   12/27/11 1000   Ampicillin-Sulbactam (UNASYN) 3 g in sodium chloride 0.9 % 100 mL IVPB        3 g 100 mL/hr over 60 Minutes Intravenous Every 6 hours 12/27/11 0957     12/26/11 1500   clindamycin (CLEOCIN) IVPB 900 mg        900 mg 100 mL/hr over 30 Minutes Intravenous 3 times per day 12/26/11 1430     12/25/11 1800   vancomycin (VANCOCIN) 1,500 mg in sodium chloride 0.9 % 500 mL IVPB  Status:  Discontinued        1,500 mg 250 mL/hr over 120 Minutes Intravenous Every 12 hours 12/25/11 0446 12/27/11 0944   12/25/11 1800   meropenem (MERREM) 1 g in sodium chloride 0.9 % 100 mL IVPB  Status:  Discontinued        1 g 200 mL/hr over 30 Minutes Intravenous 3 times per day 12/25/11 1558 12/27/11 0944   12/24/11 1230   vancomycin (VANCOCIN) 1,500 mg in sodium chloride 0.9 % 500 mL IVPB  Status:  Discontinued        1,500 mg 250 mL/hr over 120 Minutes Intravenous Every 12 hours 12/24/11 1131 12/25/11 0446   12/24/11 1200   ampicillin-sulbactam (UNASYN) 1.5 g in sodium chloride 0.9 % 50 mL IVPB  Status:  Discontinued        1.5 g 100 mL/hr over 30 Minutes Intravenous Every 6 hours 12/24/11 1130 12/25/11 1506   12/23/11 1915   clindamycin (CLEOCIN) IVPB 600 mg        600 mg 100 mL/hr over 30 Minutes Intravenous  Once 12/23/11 1906 12/23/11 2013   12/23/11 1915   vancomycin (VANCOCIN) IVPB 1000 mg/200 mL premix        1,000 mg 200 mL/hr over 60 Minutes Intravenous  Once 12/23/11 1906 12/23/11 2129          Medications: Scheduled Meds:   . ampicillin-sulbactam (UNASYN) IV  3 g Intravenous Q6H  . clindamycin (CLEOCIN) IV  900 mg Intravenous Q8H  . cloNIDine  0.2  mg Oral TID  . enoxaparin  30 mg Subcutaneous Q12H  . escitalopram  20 mg Oral Daily  . gadobenate dimeglumine  17 mL Intravenous Once  . potassium chloride  40 mEq Oral Once  . DISCONTD: ampicillin-sulbactam (UNASYN) IV  3 g Intravenous Q8H  . DISCONTD: chlorhexidine  60 mL Topical Once  . DISCONTD: HYDROmorphone PCA 0.3 mg/mL   Intravenous Q4H  . DISCONTD: meropenem (MERREM) IV  1 g Intravenous Q8H  . DISCONTD: vancomycin  1,500 mg Intravenous Q12H   Continuous Infusions:   . 0.9 % NaCl with KCl 40 mEq / L 50 mL/hr at 12/26/11 1455   PRN Meds:.acetaminophen, acetaminophen, ALPRAZolam, diphenhydrAMINE, diphenhydrAMINE, metoCLOPramide, morphine injection, naloxone, ondansetron (ZOFRAN) IV, oxyCODONE, sodium chloride, sodium chloride, DISCONTD: bupivacaine, DISCONTD: HYDROmorphone, DISCONTD: sodium chloride irrigation   Objective: Weight change:   Intake/Output Summary (Last 24 hours) at 12/27/11 1726 Last data filed at 12/27/11 1500  Gross per 24 hour  Intake   3077 ml  Output   2150 ml  Net  927 ml   Blood pressure 125/68, pulse 68, temperature 98.3 F (36.8 C), temperature source Oral, resp. rate 18, height 5\' 7"  (1.702 m), weight 170 lb (77.111 kg), SpO2 98.00%. Temp:  [97.6 F (36.4 C)-98.6 F (37 C)] 98.3 F (36.8 C) (04/26 1500) Pulse Rate:  [66-92] 68  (04/26 1500) Resp:  [12-20] 18  (04/26 1500) BP: (125-181)/(68-90) 125/68 mmHg (04/26 1500) SpO2:  [94 %-100 %] 98 % (04/26 1500)  Physical Exam: General: Alert and awake, oriented x3, not in any acute distress.  HEENT: anicteric sclera, pupils reactive to light and accommodation, EOMI, oropharynx clear and without exudate  CVS regular rate, normal r, no murmur rubs or gallops  Chest: clear to auscultation bilaterally, no wheezing, rales or rhonchi  Abdomen: soft nontender, nondistended, normal bowel sounds,  Extremities:Skin: Right arm bandaged managed erythemaing medially and to near axilla a Neuro: nonfocal,  strength and sensation intact  Neuro: nonfocal  Lab Results:  Basename 12/27/11 0604 12/26/11 0700  WBC 11.4* 18.1*  HGB 12.4* 13.1  HCT 36.1* 37.7*  PLT 376 319    BMET  Basename 12/27/11 0604 12/26/11 0700  NA 139 138  K 3.4* 3.8  CL 100 101  CO2 30 27  GLUCOSE 105* 89  BUN 3* 3*  CREATININE 0.60 0.63  CALCIUM 8.9 8.9    Micro Results: Recent Results (from the past 240 hour(s))  CULTURE, BLOOD (ROUTINE X 2)     Status: Normal (Preliminary result)   Collection Time   12/23/11  7:25 PM      Component Value Range Status Comment   Specimen Description BLOOD HAND LEFT   Final    Special Requests BOTTLES DRAWN AEROBIC ONLY 5CC   Final    Culture  Setup Time [id number]   Final    Culture     Final    Value:        BLOOD CULTURE RECEIVED NO GROWTH TO DATE CULTURE WILL BE HELD FOR 5 DAYS BEFORE ISSUING A FINAL NEGATIVE REPORT   Report Status PENDING   Incomplete   CULTURE, BLOOD (ROUTINE X 2)     Status: Normal (Preliminary result)   Collection Time   12/23/11  7:36 PM      Component Value Range Status Comment   Specimen Description BLOOD ARM LEFT   Final    Special Requests     Final    Value: BOTTLES DRAWN AEROBIC AND ANAEROBIC AERO 10CC, ANAE 8CC   Culture  Setup Time [id number]   Final    Culture     Final    Value:        BLOOD CULTURE RECEIVED NO GROWTH TO DATE CULTURE WILL BE HELD FOR 5 DAYS BEFORE ISSUING A FINAL NEGATIVE REPORT   Report Status PENDING   Incomplete   CULTURE, ROUTINE-ABSCESS     Status: Normal   Collection Time   12/23/11  9:56 PM      Component Value Range Status Comment   Specimen Description ABSCESS HAND RIGHT   Final    Special Requests PT ON BACTRUM,CLEOCIN,VANC   Final    Gram Stain     Final    Value: RARE WBC PRESENT,BOTH PMN AND MONONUCLEAR     NO ORGANISMS SEEN     Performed at [facility]   Culture FEW GROUP A STREP (S.PYOGENES) ISOLATED   Final    Report Status 12/26/2011 FINAL   Final   ANAEROBIC CULTURE     Status:  Normal (Preliminary result)   Collection Time   12/23/11  9:56 PM      Component Value Range Status Comment   Specimen Description ABSCESS HAND RIGHT   Final    Special Requests PT ON BACTRUM,CLEOCIN,VANC   Final    Gram Stain PENDING   Incomplete    Culture     Final    Value: NO ANAEROBES ISOLATED; CULTURE IN PROGRESS FOR 5 DAYS   Report Status PENDING   Incomplete   GRAM STAIN     Status: Normal   Collection Time   12/23/11  9:56 PM      Component Value Range Status Comment   Specimen Description ABSCESS HAND RIGHT   Final    Special Requests PT ON BACTRUM,CLEOCIN,VANC   Final    Gram Stain     Final    Value: RARE WBC PRESENT,BOTH PMN AND MONONUCLEAR     NO ORGANISMS SEEN   Report Status 12/23/2011 FINAL   Final   SURGICAL PCR SCREEN     Status: Normal   Collection Time   12/25/11  6:50 AM      Component Value Range Status Comment   MRSA, PCR NEGATIVE  NEGATIVE  Final    Staphylococcus aureus NEGATIVE  NEGATIVE  Final   GRAM STAIN     Status: Normal   Collection Time   12/25/11  4:58 PM      Component Value Range Status Comment   Specimen Description ABSCESS HAND RIGHT   Final    Special Requests NONE   Final    Gram Stain     Final    Value: MODERATE WBC PRESENT,BOTH PMN AND MONONUCLEAR     NO ORGANISMS SEEN     Gram Stain Report Called to,Read Back By and Verified With: Mendel Corning RN 17:40 12/25/11 ([email or web address])   Report Status 12/25/2011 FINAL   Final   CULTURE, ROUTINE-ABSCESS     Status: Normal (Preliminary result)   Collection Time   12/25/11  4:58 PM      Component Value Range Status Comment   Specimen Description ABSCESS HAND RIGHT   Final    Special Requests NONE   Final    Gram Stain     Final    Value: MODERATE WBC PRESENT,BOTH PMN AND MONONUCLEAR     NO ORGANISMS SEEN     Performed at [facility] Gram Stain Report Called to,Read Back By and Verified With: Gram Stain Report Called to,Read Back By and Verified With: Mendel Corning RN [phone] 12/25/11 WILSONM    Culture NO GROWTH 1 DAY   Final    Report Status PENDING   Incomplete   ANAEROBIC CULTURE     Status: Normal (Preliminary result)   Collection Time   12/25/11  4:58 PM      Component Value Range Status Comment   Specimen Description ABSCESS HAND RIGHT   Final    Special Requests NONE   Final    Gram Stain     Final    Value: MODERATE WBC PRESENT,BOTH PMN AND MONONUCLEAR     NO ORGANISMS SEEN     Performed at [facility] Gram Stain Report Called to,Read Back By and Verified With: Gram Stain Report Called to,Read Back By and Verified With: Mendel Corning RN [phone] 12/25/11 WILSONM   Culture     Final    Value: NO ANAEROBES ISOLATED; CULTURE IN PROGRESS FOR 5 DAYS   Report Status PENDING   Incomplete  GRAM STAIN     Status: Normal   Collection Time   12/27/11 12:41 AM      Component Value Range Status Comment   Specimen Description ABSCESS ARM RIGHT   Final    Special Requests NONE   Final    Gram Stain     Final    Value: NO ORGANISMS SEEN     NO WBC SEEN   Report Status 12/27/2011 FINAL   Final     Studies/Results: Mr Humerus Right W Wo Contrast  12/27/2011  *RADIOLOGY REPORT*  Clinical Data: Right arm redness and swelling.  Cellulitis. Abscess.  Worsening infection despite antibiotics.  MRI OF THE RIGHT HUMERUS WITHOUT AND WITH CONTRAST  Technique:  Multiplanar, multisequence MR imaging was performed both before and after administration of intravenous contrast.  Contrast: 17mL MULTIHANCE GADOBENATE DIMEGLUMINE 529 MG/ML IV SOLN  Comparison: None.  Findings: Diffuse subcutaneous edema is present in the distal left upper arm.  There is no focal fluid collection in the upper arm to suggest abscess formation.  After Gadolinium administration, there is mild subcutaneous enhancement.  Prominent fluid is present in the medial upper arm approaching the elbow.  This does not have irregular peripheral enhancement typical for abscess.  No marrow signal alterations suggesting osteomyelitis.  There is  no susceptibility artifact in the soft tissues to suggest a dissecting gas.  IMPRESSION: Extensive cellulitis most promptly affecting the medial distal upper arm.  Prominent fluid is present in the subcutaneous tissues without a defined abscess.  Original Report Authenticated By: Andreas Newport, M.D.   Mr Forearm Right Wo/w Cm  12/27/2011  *RADIOLOGY REPORT*  Clinical Data: Cellulitis.  Abscess.  MRI OF THE RIGHT FOREARM WITHOUT AND WITH CONTRAST  Technique:  Multiplanar, multisequence MR imaging was performed both before and after administration of intravenous contrast.  Contrast: 17mL MULTIHANCE GADOBENATE DIMEGLUMINE 529 MG/ML IV SOLN  Comparison: 12/23/2011 radiographs.  Findings: Diffuse edema and enhancement is present in the forearm extending through the wrist.  There are no focal fluid collections. There is prominent fluid along the medial upper forearm.  Diffuse edema extends into the hand.  The scan is technically suboptimal because of difficulty positioning the patient.  There is no effacement of fatty marrow.  No osteomyelitis is identified.  No convincing evidence of infectious myositis of the forearm.  IMPRESSION: No abscess or osteomyelitis.  Diffuse subcutaneous edema and enhancement compatible with cellulitis.  Original Report Authenticated By: Andreas Newport, M.D.      Assessment/Plan: Hector Parks is a 36 y.o. male with complicated hand abscess with GAS  Sp I and of hand x 2 by Dr. Mina Marble and then  Again with I and D of medial elbow area as well by Dr. Rica Mast last night  1) GAS infection:  =--Simplified to IV Unasyn. -- If he does well we'll change him to oral high-dose amoxicillin 500 mg 3 times daily. I would continue this for a protracted course of at least a month.  2) Screening HIV negative  Arrange followup for the patient in my clinic In the next few weeks.       LOS: 4 days   Acey Lav 12/27/2011, 5:26 PM

## 2011-12-27 NOTE — Progress Notes (Signed)
Patient Demographics  Hector Parks, is a 36 y.o. male  NWG:[id number]  [id number]:[id number]  DOB - 11/29/1975  12/23/2011  Admitting Physician Rometta Emery, MD  Outpatient Primary MD for the patient is DEFAULT,PROVIDER, MD, MD  LOS 2     Chief Complaint  Patient presents with  . Cellulitis        Subjective:   [name] today has, No headache, No chest pain, No abdominal pain - No Nausea, No new weakness tingling or numbness, No Cough - SOB.Rt hand pain.  Objective:   Filed Vitals:   12/27/11 0151 12/27/11 0405 12/27/11 0527 12/27/11 0800  BP: 168/89  181/70   Pulse: 77  70   Temp: 98 F (36.7 C)  97.6 F (36.4 C)   TempSrc: Oral  Oral   Resp: 15 13 14 20   Height:      Weight:      SpO2: 94% 97% 98% 95%    Wt Readings from Last 3 Encounters:  12/25/11 77.111 kg (170 lb)  12/25/11 77.111 kg (170 lb)  12/25/11 77.111 kg (170 lb)     Intake/Output Summary (Last 24 hours) at 12/27/11 0946 Last data filed at 12/27/11 0531  Gross per 24 hour  Intake   4111 ml  Output   2550 ml  Net   1561 ml    Exam Awake Alert, Oriented *3, No new F.N deficits, Normal affect [place].AT,PERRAL Supple Neck,No JVD, No cervical lymphadenopathy appriciated.  Symmetrical Chest wall movement, Good air movement bilaterally, CTAB RRR,No Gallops,Rubs or new Murmurs, No Parasternal Heave +ve B.Sounds, Abd Soft, Non tender, No organomegaly appriciated, No rebound -guarding or rigidity. No Cyanosis, Clubbing or edema, No new Rash or bruise, except Rt hand in bandage, redness and swelling up to axilla improving.  Data Review  CBC  Lab 12/27/11 0604 12/26/11 0700 12/25/11 0515 12/24/11 0700 12/24/11 0113 12/23/11 1919  WBC 11.4* 18.1* 16.6* 18.7* 19.2* --  HGB 12.4* 13.1 12.9* 12.6* 13.1 --  HCT 36.1* 37.7* 36.7* 35.8* 36.3* --  PLT 376 319 267 210 211 --  MCV 88.3 88.1 87.2 86.1 86.2 --  MCH 30.3 30.6 30.6 30.3 31.1 --  MCHC 34.3 34.7 35.1 35.2 36.1* --  RDW 15.2 15.1 14.8 14.5  14.3 --  LYMPHSABS -- -- -- -- 2.5 1.8  MONOABS -- -- -- -- 1.2* 2.0*  EOSABS -- -- -- -- 0.1 0.0  BASOSABS -- -- -- -- 0.0 0.0  BANDABS -- -- -- -- -- --    Chemistries   Lab 12/27/11 0604 12/26/11 0700 12/25/11 0515 12/24/11 0700 12/23/11 1919  NA 139 138 136 132* 132*  K 3.4* 3.8 3.5 3.0* 2.9*  CL 100 101 99 95* 93*  CO2 30 27 28 26 26   GLUCOSE 105* 89 99 94 108*  BUN 3* 3* <3* 6 10  CREATININE 0.60 0.63 0.66 0.67 0.76  CALCIUM 8.9 8.9 8.8 8.5 9.3  MG -- -- -- -- --  AST -- -- -- 18 18  ALT -- -- -- 25 31  ALKPHOS -- -- -- 115 104  BILITOT -- -- -- 0.2* 0.4   ------------------------------------------------------------------------------------------------------------------ estimated creatinine clearance is 118.2 ml/min (by C-G formula based on Cr of 0.6). ------------------------------------------------------------------------------------------------------------------ No results found for this basename: HGBA1C:2 in the last 72 hours ------------------------------------------------------------------------------------------------------------------ No results found for this basename: CHOL:2,HDL:2,LDLCALC:2,TRIG:2,CHOLHDL:2,LDLDIRECT:2 in the last 72 hours ------------------------------------------------------------------------------------------------------------------ No results found for this basename: TSH,T4TOTAL,FREET3,T3FREE,THYROIDAB in the last 72 hours ------------------------------------------------------------------------------------------------------------------ No results found for this basename:  VITAMINB12:2,FOLATE:2,FERRITIN:2,TIBC:2,IRON:2,RETICCTPCT:2 in the last 72 hours  Coagulation profile No results found for this basename: INR:5,PROTIME:5 in the last 168 hours  No results found for this basename: DDIMER:2 in the last 72 hours  Cardiac Enzymes No results found for this basename: CK:3,CKMB:3,TROPONINI:3,MYOGLOBIN:3 in the last 168  hours ------------------------------------------------------------------------------------------------------------------ No components found with this basename: POCBNP:3  Micro Results Recent Results (from the past 240 hour(s))  CULTURE, BLOOD (ROUTINE X 2)     Status: Normal (Preliminary result)   Collection Time   12/23/11  7:25 PM      Component Value Range Status Comment   Specimen Description BLOOD HAND LEFT   Final    Special Requests BOTTLES DRAWN AEROBIC ONLY 5CC   Final    Culture  Setup Time [id number]   Final    Culture     Final    Value:        BLOOD CULTURE RECEIVED NO GROWTH TO DATE CULTURE WILL BE HELD FOR 5 DAYS BEFORE ISSUING A FINAL NEGATIVE REPORT   Report Status PENDING   Incomplete   CULTURE, BLOOD (ROUTINE X 2)     Status: Normal (Preliminary result)   Collection Time   12/23/11  7:36 PM      Component Value Range Status Comment   Specimen Description BLOOD ARM LEFT   Final    Special Requests     Final    Value: BOTTLES DRAWN AEROBIC AND ANAEROBIC AERO 10CC, ANAE 8CC   Culture  Setup Time [id number]   Final    Culture     Final    Value:        BLOOD CULTURE RECEIVED NO GROWTH TO DATE CULTURE WILL BE HELD FOR 5 DAYS BEFORE ISSUING A FINAL NEGATIVE REPORT   Report Status PENDING   Incomplete   CULTURE, ROUTINE-ABSCESS     Status: Normal   Collection Time   12/23/11  9:56 PM      Component Value Range Status Comment   Specimen Description ABSCESS HAND RIGHT   Final    Special Requests PT ON BACTRUM,CLEOCIN,VANC   Final    Gram Stain     Final    Value: RARE WBC PRESENT,BOTH PMN AND MONONUCLEAR     NO ORGANISMS SEEN     Performed at [facility]   Culture FEW GROUP A STREP (S.PYOGENES) ISOLATED   Final    Report Status 12/26/2011 FINAL   Final   ANAEROBIC CULTURE     Status: Normal (Preliminary result)   Collection Time   12/23/11  9:56 PM      Component Value Range Status Comment   Specimen Description ABSCESS HAND RIGHT   Final    Special  Requests PT ON BACTRUM,CLEOCIN,VANC   Final    Gram Stain PENDING   Incomplete    Culture     Final    Value: NO ANAEROBES ISOLATED; CULTURE IN PROGRESS FOR 5 DAYS   Report Status PENDING   Incomplete   GRAM STAIN     Status: Normal   Collection Time   12/23/11  9:56 PM      Component Value Range Status Comment   Specimen Description ABSCESS HAND RIGHT   Final    Special Requests PT ON BACTRUM,CLEOCIN,VANC   Final    Gram Stain     Final    Value: RARE WBC PRESENT,BOTH PMN AND MONONUCLEAR     NO ORGANISMS SEEN   Report Status 12/23/2011 FINAL   Final  SURGICAL PCR SCREEN     Status: Normal   Collection Time   12/25/11  6:50 AM      Component Value Range Status Comment   MRSA, PCR NEGATIVE  NEGATIVE  Final    Staphylococcus aureus NEGATIVE  NEGATIVE  Final   GRAM STAIN     Status: Normal   Collection Time   12/25/11  4:58 PM      Component Value Range Status Comment   Specimen Description ABSCESS HAND RIGHT   Final    Special Requests NONE   Final    Gram Stain     Final    Value: MODERATE WBC PRESENT,BOTH PMN AND MONONUCLEAR     NO ORGANISMS SEEN     Gram Stain Report Called to,Read Back By and Verified With: Mendel Corning RN 17:40 12/25/11 ([email or web address])   Report Status 12/25/2011 FINAL   Final   CULTURE, ROUTINE-ABSCESS     Status: Normal (Preliminary result)   Collection Time   12/25/11  4:58 PM      Component Value Range Status Comment   Specimen Description ABSCESS HAND RIGHT   Final    Special Requests NONE   Final    Gram Stain     Final    Value: MODERATE WBC PRESENT,BOTH PMN AND MONONUCLEAR     NO ORGANISMS SEEN     Performed at [facility] Gram Stain Report Called to,Read Back By and Verified With: Gram Stain Report Called to,Read Back By and Verified With: Mendel Corning RN [phone] 12/25/11 WILSONM   Culture NO GROWTH 1 DAY   Final    Report Status PENDING   Incomplete   ANAEROBIC CULTURE     Status: Normal (Preliminary result)   Collection Time   12/25/11  4:58 PM       Component Value Range Status Comment   Specimen Description ABSCESS HAND RIGHT   Final    Special Requests NONE   Final    Gram Stain     Final    Value: MODERATE WBC PRESENT,BOTH PMN AND MONONUCLEAR     NO ORGANISMS SEEN     Performed at [facility] Gram Stain Report Called to,Read Back By and Verified With: Gram Stain Report Called to,Read Back By and Verified With: Mendel Corning RN [phone] 12/25/11 WILSONM   Culture     Final    Value: NO ANAEROBES ISOLATED; CULTURE IN PROGRESS FOR 5 DAYS   Report Status PENDING   Incomplete   GRAM STAIN     Status: Normal   Collection Time   12/27/11 12:41 AM      Component Value Range Status Comment   Specimen Description ABSCESS ARM RIGHT   Final    Special Requests NONE   Final    Gram Stain     Final    Value: NO ORGANISMS SEEN     NO WBC SEEN   Report Status 12/27/2011 FINAL   Final     Radiology Reports Dg Forearm Right  12/23/2011  *RADIOLOGY REPORT*  Clinical Data: Pain, redness and swelling of the right hand and forearm for the past 4 days.  RIGHT FOREARM - 2 VIEW  Comparison: No priors.  Findings: AP and lateral radiographs of the right forearm demonstrate no acute fracture or retained radiopaque foreign body. Overlying soft tissues appear mildly swollen.  IMPRESSION: 1.  No acute radiographic abnormality of the right forearm. Specifically, no retained radiopaque foreign body identified.  Original Report Authenticated By:  Florencia Reasons, M.D.   Dg Hand Complete Right  12/23/2011  *RADIOLOGY REPORT*  Clinical Data: Pain and swelling in the right hand and forearm for the past 4 days, getting worse.  RIGHT HAND - COMPLETE 3+ VIEW  Comparison: No priors.  Findings: Multiple views of the right hand demonstrate no acute fracture, subluxation, dislocation, retained radiopaque foreign body or joint abnormality.  There is profound soft tissue swelling in the right hand, particularly overlying the region of the metacarpals.  IMPRESSION: 1.  Profound  soft tissue swelling of the right hand overlying the metacarpals.  No unexpected retained radiopaque foreign body or underlying bony abnormality.  Original Report Authenticated By: Florencia Reasons, M.D.    Scheduled Meds:    . clindamycin (CLEOCIN) IV  900 mg Intravenous Q8H  . enoxaparin  30 mg Subcutaneous Q12H  . escitalopram  20 mg Oral Daily  . gadobenate dimeglumine  17 mL Intravenous Once  . potassium chloride  40 mEq Oral Once  . DISCONTD: chlorhexidine  60 mL Topical Once  . DISCONTD: HYDROmorphone PCA 0.3 mg/mL   Intravenous Q4H  . DISCONTD: meropenem (MERREM) IV  1 g Intravenous Q8H  . DISCONTD: vancomycin  1,500 mg Intravenous Q12H   Continuous Infusions:    . 0.9 % NaCl with KCl 40 mEq / L 50 mL/hr at 12/26/11 1455  . DISCONTD: 0.9 % NaCl with KCl 40 mEq / L 75 mL/hr at 12/25/11 1948  . DISCONTD: lactated ringers 50 mL/hr at 12/25/11 1625   PRN Meds:.acetaminophen, acetaminophen, ALPRAZolam, diphenhydrAMINE, diphenhydrAMINE, metoCLOPramide, morphine injection, naloxone, ondansetron (ZOFRAN) IV, oxyCODONE, sodium chloride, sodium chloride, DISCONTD: bupivacaine, DISCONTD: HYDROmorphone, DISCONTD: sodium chloride irrigation  Assessment & Plan   1. R. Cellulitis and abscess with Leukocytosis- hand surgery following I&D on 4-22 and again on 4-24, MRI arm shows cellulitis only, CBC and exam much better, D/W ID Bedside, ABX reduced to [place]+Unasyn, monitor closely, reduce pain meds, outpt Amox likely.    2. Hypokalemia - replaced, monitor.    3.Depression - home meds.       DVT Prophylaxis  Lovenox       Leroy Sea M.D on 12/27/2011 at 9:46 AM  [facility] Office  [phone]

## 2011-12-27 NOTE — Progress Notes (Signed)
Subjective: 1 Day Post-Op Procedure(s) (LRB): IRRIGATION AND DEBRIDEMENT EXTREMITY (Right) Patient reports pain as mild.  Feels significantly better today.  Only pain in arm feels surgical and he states this is minimal.  No fevers or ill feeling.  Objective: Vital signs in last 24 hours: Temp:  [97.6 F (36.4 C)-98.6 F (37 C)] 97.6 F (36.4 C) (04/26 0527) Pulse Rate:  [66-92] 70  (04/26 0527) Resp:  [12-20] 20  (04/26 0800) BP: (150-181)/(70-90) 181/70 mmHg (04/26 0527) SpO2:  [94 %-100 %] 95 % (04/26 0800)  Intake/Output from previous day: 04/25 0701 - 04/26 0700 In: 4591 [P.O.:1280; I.V.:2311; IV Piggyback:1000] Out: 2550 [Urine:2525; Blood:25] Intake/Output this shift: Total I/O In: 340 [P.O.:240; IV Piggyback:100] Out: -    Basename 12/27/11 0604 12/26/11 0700 12/25/11 0515  HGB 12.4* 13.1 12.9*    Basename 12/27/11 0604 12/26/11 0700  WBC 11.4* 18.1*  RBC 4.09* 4.28  HCT 36.1* 37.7*  PLT 376 319    Basename 12/27/11 0604 12/26/11 0700  NA 139 138  K 3.4* 3.8  CL 100 101  CO2 30 27  BUN 3* 3*  CREATININE 0.60 0.63  GLUCOSE 105* 89  CALCIUM 8.9 8.9   No results found for this basename: LABPT:2,INR:2 in the last 72 hours  light touch sensation and capillary refill all digits.  wiggles all fingers.  drsg c/d/i.  erythema in upper arm decreased and less intense  Assessment/Plan: 1 Day Post-Op Procedure(s) (LRB): IRRIGATION AND DEBRIDEMENT EXTREMITY (Right) Much improved today.  Feels well.  WBC with significant decrease.  Erythema improving.  Start hydrotherapy with packing change tomorrow.  If continues to do well can probably d/c home over weekend.  F/u in office on Monday for continued whirlpool therapy.  Abx per ID.  Marsena Taff R 12/27/2011, 3:26 PM

## 2011-12-27 NOTE — Transfer of Care (Signed)
Immediate Anesthesia Transfer of Care Note  Patient: Hector Parks  Procedure(s) Performed: Procedure(s) (LRB): IRRIGATION AND DEBRIDEMENT EXTREMITY (Right)  Patient Location: PACU  Anesthesia Type: General  Level of Consciousness: awake, alert  and oriented  Airway & Oxygen Therapy: Patient connected to nasal cannula oxygen  Post-op Assessment: Report given to PACU RN, Post -op Vital signs reviewed and stable and Patient moving all extremities  Post vital signs: Reviewed and stable  Complications: No apparent anesthesia complications

## 2011-12-27 NOTE — Anesthesia Postprocedure Evaluation (Signed)
Anesthesia Post Note  Patient: Hector Parks  Procedure(s) Performed: Procedure(s) (LRB): IRRIGATION AND DEBRIDEMENT EXTREMITY (Right)  Anesthesia type: general  Patient location: PACU  Post pain: Pain level controlled  Post assessment: Patient's Cardiovascular Status Stable  Last Vitals:  Filed Vitals:   12/27/11 0527  BP: 181/70  Pulse: 70  Temp: 36.4 C  Resp: 14    Post vital signs: Reviewed and stable  Level of consciousness: sedated  Complications: No apparent anesthesia complications

## 2011-12-27 NOTE — Op Note (Signed)
NAMEBETTY, BROOKS NO.:  [id number]  MEDICAL RECORD NO.:  [id number]  LOCATION:  5127                         FACILITY:  [facility]  PHYSICIAN:  Betha Loa, MD        DATE OF BIRTH:  06-Aug-1976  DATE OF PROCEDURE:  12/27/2011 DATE OF DISCHARGE:                              OPERATIVE REPORT   PREOPERATIVE DIAGNOSIS:  Right upper extremity Streptococcal infection.  POSTOPERATIVE DIAGNOSIS:  Right upper extremity Streptococcal infection.  PROCEDURE:  Irrigation and debridement of skin and subcutaneous tissues, right upper extremity at hand, volar wrist, and medial elbow.  SURGEON:  Betha Loa, MD  ASSISTANTS:  None.  ANESTHESIA:  General.  IV FLUIDS:  Per anesthesia flow sheet.  ESTIMATED BLOOD LOSS:  Minimal.  COMPLICATIONS:  None.  SPECIMENS:  None.  TOURNIQUET TIME:  42 minutes.  DISPOSITION:  Stable to PACU.  INDICATIONS:  Mr. Rylee is a 36 year old male who has undergone 2 irrigation and debridements of the right upper extremity by Dr. Mina Marble for Strep infection.  His last irrigation and debridement was yesterday. His white count has remained high.  He has not felt ill or had fever. On examination, he had significant erythema of the medial side of the upper arm and elbow.  He was tender in this area.  An MRI was done today showing significant edema and fluid in the subcutaneous tissues.  No specific abscess was noted.  I discussed at length with Mr. Wyles the nature of the condition.  He settled on returning to the operating room for irrigation and debridement of his previous surgical sites and irrigation and debridement of the medial side of the upper arm.  Risks, benefits, and alternatives of the surgery were discussed including risk of blood loss; infection; damage to nerves, vessels, tendons, ligaments, bone; failure of surgery; need for additional surgery; complications with wound healing; continued pain; continued infection; need for  repeat irrigation and debridement.  He voiced understanding of these risks and elected to proceed.  OPERATIVE COURSE:  After being identified preoperatively by myself, the patient and I agreed upon the procedure and site of procedure.  Surgical site was marked.  The risks, benefits, and alternatives of the surgery were reviewed and he wished to proceed.  Surgical consent had been signed.  He was transferred to the operating room and placed on the operating table in supine position with the right upper extremity on an arm board.  General anesthesia was induced by the anesthesiologist.  The right upper extremity was prepped and draped in normal sterile orthopedic fashion.  Surgical pause was performed between surgeons, Anesthesia, and operating room staff and all were in agreement as to the patient, procedure, and site of procedure.  Tourniquet at the proximal aspect of the extremity was inflated to 250 mmHg after gravity exsanguination of the limb.  An incision was made at the medial side of the elbow.  This was carried into subcutaneous tissues by spreading technique.  Bipolar electrocautery was used throughout the case to obtain hemostasis.  There was a significant amount of edema in the subcutaneous tissues.  This was somewhat cloudy.  Cultures were taken for aerobes and anaerobes.  In the area where the subcutaneous tissues easily separated from the fascia, this was done.  This occurred more easily on the posterior aspect.  The wound was copiously irrigated with 3000 mL of sterile saline by cysto tubing.  Attention was turned to the wounds of the hand.  The sutures had been removed.  The wounds were opened.  There was no significant reaccumulation of any purulence or edema.  These wounds were lightly debrided using a curette.  Again, 3000 mL of sterile saline was irrigated through these wounds by cysto tubing as well.  Bipolar electrocautery was used to obtain good hemostasis. All  wounds were packed open with half-inch iodoform gauze.  20 mL of 0.25% plain Marcaine was injected into the wound edges to aid in postoperative analgesia.  The wounds were all dressed with sterile Xeroform, 4x4s, ABDs and wrapped with Kerlix and Ace bandage. Tourniquet was deflated at 42 minutes.  The fingertips were pink with brisk capillary refill after deflation of the tourniquet.  Operative drapes were broken down, and the patient was awoken from anesthesia safely.  He was transferred back to stretcher and taken to PACU in stable condition.  He will be continued on IV antibiotics.  We will start whirlpool therapy in 2 days.     Betha Loa, MD     KK/MEDQ  D:  12/27/2011  T:  12/27/2011  Job:  [id number]

## 2011-12-27 NOTE — Preoperative (Signed)
Beta Blockers   Reason not to administer Beta Blockers:Not Applicable 

## 2011-12-28 NOTE — Progress Notes (Signed)
Patient Demographics  Hector Parks, is a 36 y.o. male  ZOX:[id number]  WJX:[id number]  DOB - 09/27/1975  12/23/2011  Admitting Physician Rometta Emery, MD  Outpatient Primary MD for the patient is DEFAULT,PROVIDER, MD, MD  LOS 2     Chief Complaint  Patient presents with  . Cellulitis        Subjective:   Hector Parks today has, No headache, No chest pain, No abdominal pain - No Nausea, No new weakness tingling or numbness, No Cough - SOB. Improving Rt hand pain.  Objective:   Filed Vitals:   12/27/11 0800 12/27/11 1500 12/27/11 2128 12/28/11 0532  BP:  125/68 132/71 130/65  Pulse:  68 66 66  Temp:  98.3 F (36.8 C) 99.4 F (37.4 C) 98.9 F (37.2 C)  TempSrc:  Oral Oral Oral  Resp: 20 18 18 18   Height:      Weight:      SpO2: 95% 98% 99% 97%    Wt Readings from Last 3 Encounters:  12/25/11 77.111 kg (170 lb)  12/25/11 77.111 kg (170 lb)  12/25/11 77.111 kg (170 lb)     Intake/Output Summary (Last 24 hours) at 12/28/11 1017 Last data filed at 12/28/11 0533  Gross per 24 hour  Intake    780 ml  Output    300 ml  Net    480 ml    Exam Awake Alert, Oriented *3, No new F.N deficits, Normal affect .AT,PERRAL Supple Neck,No JVD, No cervical lymphadenopathy appriciated.  Symmetrical Chest wall movement, Good air movement bilaterally, CTAB RRR,No Gallops,Rubs or new Murmurs, No Parasternal Heave +ve B.Sounds, Abd Soft, Non tender, No organomegaly appriciated, No rebound -guarding or rigidity. No Cyanosis, Clubbing or edema, No new Rash or bruise, except Rt hand in bandage, redness and swelling up to axilla improving.   Data Review  CBC  Lab 12/27/11 0604 12/26/11 0700 12/25/11 0515 12/24/11 0700 12/24/11 0113 12/23/11 1919  WBC 11.4* 18.1* 16.6* 18.7* 19.2* --  HGB 12.4* 13.1 12.9* 12.6* 13.1 --  HCT 36.1* 37.7* 36.7* 35.8* 36.3* --  PLT 376 319 267 210 211 --  MCV 88.3 88.1 87.2 86.1 86.2 --  MCH 30.3 30.6 30.6 30.3 31.1 --  MCHC 34.3 34.7  35.1 35.2 36.1* --  RDW 15.2 15.1 14.8 14.5 14.3 --  LYMPHSABS -- -- -- -- 2.5 1.8  MONOABS -- -- -- -- 1.2* 2.0*  EOSABS -- -- -- -- 0.1 0.0  BASOSABS -- -- -- -- 0.0 0.0  BANDABS -- -- -- -- -- --    Chemistries   Lab 12/28/11 0533 12/27/11 0604 12/26/11 0700 12/25/11 0515 12/24/11 0700 12/23/11 1919  NA -- 139 138 136 132* 132*  K 4.0 3.4* 3.8 3.5 3.0* --  CL -- 100 101 99 95* 93*  CO2 -- 30 27 28 26 26   GLUCOSE -- 105* 89 99 94 108*  BUN -- 3* 3* <3* 6 10  CREATININE -- 0.60 0.63 0.66 0.67 0.76  CALCIUM -- 8.9 8.9 8.8 8.5 9.3  MG -- -- -- -- -- --  AST -- -- -- -- 18 18  ALT -- -- -- -- 25 31  ALKPHOS -- -- -- -- 115 104  BILITOT -- -- -- -- 0.2* 0.4   ------------------------------------------------------------------------------------------------------------------ estimated creatinine clearance is 118.2 ml/min (by C-G formula based on Cr of 0.6). ------------------------------------------------------------------------------------------------------------------ No results found for this basename: HGBA1C:2 in the last 72 hours ------------------------------------------------------------------------------------------------------------------ No results found for this basename: CHOL:2,HDL:2,LDLCALC:2,TRIG:2,CHOLHDL:2,LDLDIRECT:2 in  the last 72 hours ------------------------------------------------------------------------------------------------------------------ No results found for this basename: TSH,T4TOTAL,FREET3,T3FREE,THYROIDAB in the last 72 hours ------------------------------------------------------------------------------------------------------------------ No results found for this basename: VITAMINB12:2,FOLATE:2,FERRITIN:2,TIBC:2,IRON:2,RETICCTPCT:2 in the last 72 hours  Coagulation profile No results found for this basename: INR:5,PROTIME:5 in the last 168 hours  No results found for this basename: DDIMER:2 in the last 72 hours  Cardiac Enzymes No results found  for this basename: CK:3,CKMB:3,TROPONINI:3,MYOGLOBIN:3 in the last 168 hours ------------------------------------------------------------------------------------------------------------------ No components found with this basename: POCBNP:3  Micro Results Recent Results (from the past 240 hour(s))  CULTURE, BLOOD (ROUTINE X 2)     Status: Normal (Preliminary result)   Collection Time   12/23/11  7:25 PM      Component Value Range Status Comment   Specimen Description BLOOD HAND LEFT   Final    Special Requests BOTTLES DRAWN AEROBIC ONLY 5CC   Final    Culture  Setup Time [id number]   Final    Culture     Final    Value:        BLOOD CULTURE RECEIVED NO GROWTH TO DATE CULTURE WILL BE HELD FOR 5 DAYS BEFORE ISSUING A FINAL NEGATIVE REPORT   Report Status PENDING   Incomplete   CULTURE, BLOOD (ROUTINE X 2)     Status: Normal (Preliminary result)   Collection Time   12/23/11  7:36 PM      Component Value Range Status Comment   Specimen Description BLOOD ARM LEFT   Final    Special Requests     Final    Value: BOTTLES DRAWN AEROBIC AND ANAEROBIC AERO 10CC, ANAE 8CC   Culture  Setup Time [id number]   Final    Culture     Final    Value:        BLOOD CULTURE RECEIVED NO GROWTH TO DATE CULTURE WILL BE HELD FOR 5 DAYS BEFORE ISSUING A FINAL NEGATIVE REPORT   Report Status PENDING   Incomplete   CULTURE, ROUTINE-ABSCESS     Status: Normal   Collection Time   12/23/11  9:56 PM      Component Value Range Status Comment   Specimen Description ABSCESS HAND RIGHT   Final    Special Requests PT ON BACTRUM,CLEOCIN,VANC   Final    Gram Stain     Final    Value: RARE WBC PRESENT,BOTH PMN AND MONONUCLEAR     NO ORGANISMS SEEN     Performed at [facility]   Culture FEW GROUP A STREP (S.PYOGENES) ISOLATED   Final    Report Status 12/26/2011 FINAL   Final   ANAEROBIC CULTURE     Status: Normal (Preliminary result)   Collection Time   12/23/11  9:56 PM      Component Value Range Status  Comment   Specimen Description ABSCESS HAND RIGHT   Final    Special Requests PT ON BACTRUM,CLEOCIN,VANC   Final    Gram Stain PENDING   Incomplete    Culture     Final    Value: NO ANAEROBES ISOLATED; CULTURE IN PROGRESS FOR 5 DAYS   Report Status PENDING   Incomplete   GRAM STAIN     Status: Normal   Collection Time   12/23/11  9:56 PM      Component Value Range Status Comment   Specimen Description ABSCESS HAND RIGHT   Final    Special Requests PT ON BACTRUM,CLEOCIN,VANC   Final    Gram Stain     Final  Value: RARE WBC PRESENT,BOTH PMN AND MONONUCLEAR     NO ORGANISMS SEEN   Report Status 12/23/2011 FINAL   Final   SURGICAL PCR SCREEN     Status: Normal   Collection Time   12/25/11  6:50 AM      Component Value Range Status Comment   MRSA, PCR NEGATIVE  NEGATIVE  Final    Staphylococcus aureus NEGATIVE  NEGATIVE  Final   GRAM STAIN     Status: Normal   Collection Time   12/25/11  4:58 PM      Component Value Range Status Comment   Specimen Description ABSCESS HAND RIGHT   Final    Special Requests NONE   Final    Gram Stain     Final    Value: MODERATE WBC PRESENT,BOTH PMN AND MONONUCLEAR     NO ORGANISMS SEEN     Gram Stain Report Called to,Read Back By and Verified With: Mendel Corning RN 17:40 12/25/11 ([email or web address])   Report Status 12/25/2011 FINAL   Final   CULTURE, ROUTINE-ABSCESS     Status: Normal (Preliminary result)   Collection Time   12/25/11  4:58 PM      Component Value Range Status Comment   Specimen Description ABSCESS HAND RIGHT   Final    Special Requests NONE   Final    Gram Stain     Final    Value: MODERATE WBC PRESENT,BOTH PMN AND MONONUCLEAR     NO ORGANISMS SEEN     Performed at [facility] Gram Stain Report Called to,Read Back By and Verified With: Gram Stain Report Called to,Read Back By and Verified With: Mendel Corning RN [phone] 12/25/11 WILSONM   Culture NO GROWTH 2 DAYS   Final    Report Status PENDING   Incomplete   ANAEROBIC CULTURE     Status:  Normal (Preliminary result)   Collection Time   12/25/11  4:58 PM      Component Value Range Status Comment   Specimen Description ABSCESS HAND RIGHT   Final    Special Requests NONE   Final    Gram Stain     Final    Value: MODERATE WBC PRESENT,BOTH PMN AND MONONUCLEAR     NO ORGANISMS SEEN     Performed at [facility] Gram Stain Report Called to,Read Back By and Verified With: Gram Stain Report Called to,Read Back By and Verified With: Mendel Corning RN [phone] 12/25/11 WILSONM   Culture     Final    Value: NO ANAEROBES ISOLATED; CULTURE IN PROGRESS FOR 5 DAYS   Report Status PENDING   Incomplete   CULTURE, ROUTINE-ABSCESS     Status: Normal (Preliminary result)   Collection Time   12/27/11 12:41 AM      Component Value Range Status Comment   Specimen Description ABSCESS ARM RIGHT   Final    Special Requests NONE   Final    Gram Stain     Final    Value: NO WBC SEEN     NO ORGANISMS SEEN     Performed at [facility]   Culture NO GROWTH   Final    Report Status PENDING   Incomplete   GRAM STAIN     Status: Normal   Collection Time   12/27/11 12:41 AM      Component Value Range Status Comment   Specimen Description ABSCESS ARM RIGHT   Final    Special Requests NONE  Final    Gram Stain     Final    Value: NO ORGANISMS SEEN     NO WBC SEEN   Report Status 12/27/2011 FINAL   Final     Radiology Reports Dg Forearm Right  12/23/2011  *RADIOLOGY REPORT*  Clinical Data: Pain, redness and swelling of the right hand and forearm for the past 4 days.  RIGHT FOREARM - 2 VIEW  Comparison: No priors.  Findings: AP and lateral radiographs of the right forearm demonstrate no acute fracture or retained radiopaque foreign body. Overlying soft tissues appear mildly swollen.  IMPRESSION: 1.  No acute radiographic abnormality of the right forearm. Specifically, no retained radiopaque foreign body identified.  Original Report Authenticated By: Florencia Reasons, M.D.   Dg Hand Complete  Right  12/23/2011  *RADIOLOGY REPORT*  Clinical Data: Pain and swelling in the right hand and forearm for the past 4 days, getting worse.  RIGHT HAND - COMPLETE 3+ VIEW  Comparison: No priors.  Findings: Multiple views of the right hand demonstrate no acute fracture, subluxation, dislocation, retained radiopaque foreign body or joint abnormality.  There is profound soft tissue swelling in the right hand, particularly overlying the region of the metacarpals.  IMPRESSION: 1.  Profound soft tissue swelling of the right hand overlying the metacarpals.  No unexpected retained radiopaque foreign body or underlying bony abnormality.  Original Report Authenticated By: Florencia Reasons, M.D.    Scheduled Meds:    . ampicillin-sulbactam (UNASYN) IV  3 g Intravenous Q6H  . cloNIDine  0.2 mg Oral TID  . enoxaparin  30 mg Subcutaneous Q12H  . escitalopram  20 mg Oral Daily  . potassium chloride  40 mEq Oral Once  . DISCONTD: clindamycin (CLEOCIN) IV  900 mg Intravenous Q8H   Continuous Infusions:   PRN Meds:.acetaminophen, acetaminophen, ALPRAZolam, diphenhydrAMINE, diphenhydrAMINE, metoCLOPramide, morphine injection, naloxone, ondansetron (ZOFRAN) IV, oxyCODONE, sodium chloride, sodium chloride  Assessment & Plan   1. R. Cellulitis and abscess with Leukocytosis- hand surgery following I&D on 4-22 and again on 4-24, MRI arm shows cellulitis only, CBC and exam much better, D/W ID Bedside on 12/27/2011, ABX reduced to Clinda+Unasyn, monitor closely, reduce pain meds, clinically cellulitis is about 60-70% better on exam, leukocytosis almost resolved, if continues to respond likely will discharge in a.m. with outpt Amox and close followup with hand surgeon and infectious disease.    2. Hypokalemia - replaced, resolved.    3.Depression - home meds.       DVT Prophylaxis  Lovenox       Leroy Sea M.D on 12/28/2011 at 10:17 AM  [facility] Office  [phone]

## 2011-12-28 NOTE — Progress Notes (Signed)
Hydrotherapy Evaluation   12/28/11 1205  Subjective Assessment  Subjective Pt states his hand rapidly got worse.  Patient and Family Stated Goals To get better  Date of Onset 12/21/11  Prior Treatments antibiotics, multiple I&D's  Evaluation and Treatment  Evaluation and Treatment Procedures Explained to Patient/Family Yes  Evaluation and Treatment Procedures agreed to  Wound 12/28/11 Other (Comment) Hand Right;Posterior Open surgical incision  Date First Assessed/Time First Assessed: 12/28/11 1000   Wound Type: (c) Other (Comment)  Location: Hand  Location Orientation: Right;Posterior  Wound Description (Comments): Open surgical incision  Site / Wound Assessment Pink;Red;Bleeding  % Wound base Red or Granulating 95%  % Wound base Yellow 5%  Peri-wound Assessment Edema;Erythema (blanchable);Purple  Wound Length (cm) 6 cm  Wound Width (cm) 2 cm  Wound Depth (cm) 0.5 cm  Undermining (cm) 2-3 cm medially and laterally  Margins Unattacted edges (unapproximated)  Drainage Amount Moderate  Drainage Description Serosanguineous  Treatment Hydrotherapy (Pulse lavage);Packing (Impregnated strip)  Dressing Type Moist to dry;Gauze (Comment);Compression wrap (Packed with 1/4" saline soaked packing strips)  Dressing Changed Changed  Dressing Status Clean;Dry  Wound 12/28/11 Other (Comment) Wrist Anterior;Right Open surgical incision  Date First Assessed/Time First Assessed: 12/28/11 1000   Wound Type: Other (Comment)  Location: Wrist  Location Orientation: Anterior;Right  Wound Description (Comments): Open surgical incision  Site / Wound Assessment Pink;Red  % Wound base Red or Granulating 95%  % Wound base Yellow 5%  Peri-wound Assessment Erythema (blanchable);Edema  Wound Length (cm) 3 cm  Wound Width (cm) 1 cm  Wound Depth (cm) 0.2 cm  Margins Unattacted edges (unapproximated)  Drainage Amount Moderate  Drainage Description Serosanguineous  Treatment Hydrotherapy (Pulse lavage);Packing  (Impregnated strip)  Dressing Type Moist to dry;Gauze (Comment);Compression wrap (packed with saline soaked 1/4" iodoform packing strips)  Dressing Changed Changed  Dressing Status Clean;Dry  Wound 12/28/11 Other (Comment) Arm Right;Medial Open surgical wound proximal to elbow  Date First Assessed/Time First Assessed: 12/28/11 1000   Wound Type: Other (Comment)  Location: Arm  Location Orientation: Right;Medial  Wound Description (Comments): Open surgical wound proximal to elbow  Site / Wound Assessment Pink;Red  % Wound base Red or Granulating 95%  % Wound base Yellow 5%  Peri-wound Assessment Erythema (blanchable);Edema  Wound Length (cm) 12 cm  Wound Width (cm) 3 cm  Wound Depth (cm) 1.5 cm  Undermining (cm) 1 cm laterally and medially, 2 cm proximally  Margins Unattacted edges (unapproximated)  Drainage Amount Moderate  Drainage Description Serosanguineous  Treatment Hydrotherapy (Pulse lavage);Packing (Impregnated strip)  Dressing Type Moist to dry;Gauze (Comment);Compression wrap (Packed with saline soaked 1/4" iodoform packing strips)  Dressing Changed Changed  Dressing Status Clean;Dry  Hydrotherapy  Pulsed Lavage with Suction (psi) 4 psi  Pulsed Lavage with Suction - Normal Saline Used 1000 mL  Pulsed Lavage Tip Tip with splash shield  Pulsed lavage therapy - wound location Rt hand, wrist, and arm  Wound Therapy - Assess/Plan/Recommendations  Wound Therapy - Clinical Statement Pt referred to hydrotherapy with open surgical incisions to RUE.  Can benefit from hydrotherapy to cleanse wound and promote healing.  Wound Therapy - Functional Problem List Decr use of RUE  Factors Delaying/Impairing Wound Healing Tobacco use;Infection - systemic/local  Hydrotherapy Plan Debridement;Dressing change;Patient/family education;Pulsatile lavage with suction  Wound Therapy - Frequency Other (comment) (Per hand surgeon)  Wound Therapy - Follow Up Recommendations Other (comment) (MD's  office)  Wound Therapy Goals - Improve the function of patient's integumentary system by progressing the wound(s) through  the phases of wound healing by:  Decrease Necrotic Tissue to 0  Decrease Necrotic Tissue - Progress Goal set today  Increase Granulation Tissue to 100  Increase Granulation Tissue - Progress Goal set today  Improve Drainage Characteristics Min  Improve Drainage Characteristics - Progress Goal set today  Patient/Family will be able to  verbalize range of motion of RUE  Patient/Family Instruction Goal - Progress Goal set today  Goals/treatment plan/discharge plan were made with and agreed upon by patient/family Yes  Time For Goal Achievement 7 days  Wound Therapy - Potential for Goals Good    [facility] PT [phone]

## 2011-12-29 LAB — ANAEROBIC CULTURE

## 2011-12-29 MED ORDER — AMOXICILLIN 500 MG PO CAPS: 500.0000 mg | ORAL_CAPSULE | Freq: Three times a day (TID) | ORAL | Status: AC

## 2011-12-29 MED ORDER — ALPRAZOLAM 0.5 MG PO TABS: 0.5000 mg | ORAL_TABLET | Freq: Every evening | ORAL | Status: DC | PRN

## 2011-12-29 MED ORDER — HYDROCODONE-ACETAMINOPHEN 7.5-500 MG PO TABS: 1.0000 | ORAL_TABLET | Freq: Three times a day (TID) | ORAL | Status: DC | PRN

## 2011-12-29 NOTE — Progress Notes (Signed)
   CARE MANAGEMENT NOTE 12/29/2011  Patient:  Hector Parks, Hector Parks   Account Number:  [id number]  Date Initiated:  12/25/2011  Documentation initiated by:  Donn Pierini  Subjective/Objective Assessment:   Pt admitted with cellulitis and abscess     Action/Plan:   PTA pt lived at home with wife- was independent- surgical I&D   Anticipated DC Date:  12/30/2011   Anticipated DC Plan:  HOME W HOME HEALTH SERVICES  In-house referral  Clinical Social Worker      DC Associate Professor  CM consult      [facility] Choice  HOME HEALTH   Choice offered to / List presented to:  C-1 Patient        HH arranged  HH-1 RN  HH-2 PT  HH-3 OT      [facility] agency  [facility]   Status of service:  Completed, signed off Medicare Important Message given?   (If response is "NO", the following Medicare IM given date fields will be blank) Date Medicare IM given:   Date Additional Medicare IM given:    Discharge Disposition:  HOME W HOME HEALTH SERVICES  Per UR Regulation:    If discussed at Long Length of Stay Meetings, dates discussed:    Comments:  12/29/2011 1145 Contacted [facility] for scheduled d/c home today with HH. IV abx is not needed, pt was switched to oral abx. Isidoro Donning RN CCM Case Mgmt phone [phone]   12/26/11 15:35 Letha Cape RN, BSN [phone] patient is for MRI today, patient will be here a couple of days, he has a picc line already.  Patient most likely will need HHRN for IV ABX.  Patient chose to work with [facility], referral made for [facility] to [facility], Hilda Lias notified.  Soc will begin 24-48 hrs post discharge.  12/25/11- 1200- Donn Pierini RN, BSN [phone] Pt for I&D today per surgery- will need IV abx and wound care at discharge. Will f/u for d/c plan LTAC vs SNF vs HH.

## 2011-12-29 NOTE — Discharge Instructions (Signed)
Keep right arm clean and dry.  Follow with Primary MD in 7 days   Get CBC, CMP, checked 7 days by Primary MD and again as instructed by your Primary MD.    Get Medicines reviewed and adjusted.  Please request your Prim.MD to go over all Hospital Tests and Procedure/Radiological results at the follow up, please get all Hospital records sent to your Prim MD by signing hospital release before you go home.  Activity: Fall precautions use walker/cane & assistance as needed  Diet: Heart Healthy   Disposition Home  If you experience worsening of your admission symptoms, develop shortness of breath, life threatening emergency, suicidal or homicidal thoughts you must seek medical attention immediately by calling 911 or calling your MD immediately  if symptoms less severe.  You Must read complete instructions/literature along with all the possible adverse reactions/side effects for all the Medicines you take and that have been prescribed to you. Take any new Medicines after you have completely understood and accpet all the possible adverse reactions/side effects.   Do not drive if your were admitted for syncope or siezures until you have seen by Primary MD or a Neurologist and advised to drive.  Do not drive when taking Pain medications.    Do not take more than prescribed Pain, Sleep and Anxiety Medications  Special Instructions: If you have smoked or chewed Tobacco  in the last 2 yrs please stop smoking, stop any regular Alcohol  and or any Recreational drug use.  Wear Seat belts while driving.  Hector Parks was admitted to the Hospital on 12/23/2011 and Discharged on Discharge Date 12/29/2011 and should be excused from work/school   for 14  days starting 12/23/2011 , may return to work/school without any restrictions.  Call Susa Raring MD, [facility] [phone] with questions.  Leroy Sea M.D on 12/29/2011,at 10:57 AM  [facility] Office  [phone]

## 2011-12-29 NOTE — Discharge Summary (Signed)
Hector Parks, 36 y.o., is a 36 y.o. male  DOB 1976-05-07  MRN [id number].  Admission date: 12/23/2011  Discharge Date 12/29/2011  Primary MD DEFAULT,PROVIDER, MD, MD  Admitting Physician Rometta Emery, MD  Admission Diagnosis  Abscess and cellulitis [682.9] cellulitis RIGHT HAND ABCESS Infected Right Arm  Discharge Diagnosis   Principal Problem:  *Cellulitis and abscess Active Problems:  Hypokalemia  Leucocytosis  Dehydration  Depression    Past Medical History  Diagnosis Date  . Spontaneous pneumothorax     Past Surgical History  Procedure Date  . I&d extremity 12/23/2011    Procedure: IRRIGATION AND DEBRIDEMENT EXTREMITY;  Surgeon: Marlowe Shores, MD;  Location: [facility] OR;  Service: Orthopedics;  Laterality: Right;  . I&d extremity 12/25/2011    Procedure: IRRIGATION AND DEBRIDEMENT EXTREMITY;  Surgeon: Marlowe Shores, MD;  Location: [facility] OR;  Service: Orthopedics;  Laterality: Right;  REPEAT I & D RIGHT HAND   . I&d extremity 12/26/2011    Procedure: IRRIGATION AND DEBRIDEMENT EXTREMITY;  Surgeon: Tami Ribas, MD;  Location: [facility] OR;  Service: Orthopedics;  Laterality: Right;     Hospital Course See H&P, Labs, Consult and Test reports for all details in brief, patient was admitted for     1. R. Cellulitis and abscess with Leukocytosis- hand surgery following I&D on 4-22 and again on 4-24, MRI arm shows cellulitis only, CBC and clinical exam much better, D/W ID Bedside on 12/27/2011, and a gain over the phone on 12/29/2011 ABX reduced to oral amoxicillin for 10 more days, has received between 5-6 days of IV antibiotics here, monitor closely as outpatient, he will get home health nurse for wound care, he will follow with hand surgery tomorrow in the office for further treatment and infectious disease in one week. Will place him on antibiotics for 10 more days thereafter based on clinical response it could be stopped or continued.    2. Hypokalemia - replaced,  resolved.    3.Depression - home meds.    Consults Hand Surgery, ID  Significant Tests:  See full reports for all details     Dg Forearm Right  12/23/2011  *RADIOLOGY REPORT*  Clinical Data: Pain, redness and swelling of the right hand and forearm for the past 4 days.  RIGHT FOREARM - 2 VIEW  Comparison: No priors.  Findings: AP and lateral radiographs of the right forearm demonstrate no acute fracture or retained radiopaque foreign body. Overlying soft tissues appear mildly swollen.  IMPRESSION: 1.  No acute radiographic abnormality of the right forearm. Specifically, no retained radiopaque foreign body identified.  Original Report Authenticated By: Florencia Reasons, M.D.   Mr Humerus Right W Wo Contrast  12/27/2011  *RADIOLOGY REPORT*  Clinical Data: Right arm redness and swelling.  Cellulitis. Abscess.  Worsening infection despite antibiotics.  MRI OF THE RIGHT HUMERUS WITHOUT AND WITH CONTRAST  Technique:  Multiplanar, multisequence MR imaging was performed both before and after administration of intravenous contrast.  Contrast: 17mL MULTIHANCE GADOBENATE DIMEGLUMINE 529 MG/ML IV SOLN  Comparison: None.  Findings: Diffuse subcutaneous edema is present in the distal left upper arm.  There is no focal fluid collection in the upper arm to suggest abscess formation.  After Gadolinium administration, there is mild subcutaneous enhancement.  Prominent fluid is present in the medial upper arm approaching the elbow.  This does not have irregular peripheral enhancement typical for abscess.  No marrow signal alterations suggesting osteomyelitis.  There is no susceptibility artifact in the soft  tissues to suggest a dissecting gas.  IMPRESSION: Extensive cellulitis most promptly affecting the medial distal upper arm.  Prominent fluid is present in the subcutaneous tissues without a defined abscess.  Original Report Authenticated By: Andreas Newport, M.D.   Mr Forearm Right Wo/w Cm  12/27/2011  *RADIOLOGY  REPORT*  Clinical Data: Cellulitis.  Abscess.  MRI OF THE RIGHT FOREARM WITHOUT AND WITH CONTRAST  Technique:  Multiplanar, multisequence MR imaging was performed both before and after administration of intravenous contrast.  Contrast: 17mL MULTIHANCE GADOBENATE DIMEGLUMINE 529 MG/ML IV SOLN  Comparison: 12/23/2011 radiographs.  Findings: Diffuse edema and enhancement is present in the forearm extending through the wrist.  There are no focal fluid collections. There is prominent fluid along the medial upper forearm.  Diffuse edema extends into the hand.  The scan is technically suboptimal because of difficulty positioning the patient.  There is no effacement of fatty marrow.  No osteomyelitis is identified.  No convincing evidence of infectious myositis of the forearm.  IMPRESSION: No abscess or osteomyelitis.  Diffuse subcutaneous edema and enhancement compatible with cellulitis.  Original Report Authenticated By: Andreas Newport, M.D.   Dg Hand Complete Right  12/23/2011  *RADIOLOGY REPORT*  Clinical Data: Pain and swelling in the right hand and forearm for the past 4 days, getting worse.  RIGHT HAND - COMPLETE 3+ VIEW  Comparison: No priors.  Findings: Multiple views of the right hand demonstrate no acute fracture, subluxation, dislocation, retained radiopaque foreign body or joint abnormality.  There is profound soft tissue swelling in the right hand, particularly overlying the region of the metacarpals.  IMPRESSION: 1.  Profound soft tissue swelling of the right hand overlying the metacarpals.  No unexpected retained radiopaque foreign body or underlying bony abnormality.  Original Report Authenticated By: Florencia Reasons, M.D.     Today   Subjective:   Hector Parks today has no headache,no chest abdominal pain,no new weakness tingling or numbness, feels much better wants to go home today.    Objective:   Blood pressure 132/75, pulse 71, temperature 97.5 F (36.4 C), temperature source Oral,  resp. rate 18, height 5\' 7"  (1.702 m), weight 77.111 kg (170 lb), SpO2 98.00%. No intake or output data in the 24 hours ending 12/29/11 1057  Exam Awake Alert, Oriented *3, No new F.N deficits, Normal affect [place].AT,PERRAL Supple Neck,No JVD, No cervical lymphadenopathy appriciated.  Symmetrical Chest wall movement, Good air movement bilaterally, CTAB RRR,No Gallops,Rubs or new Murmurs, No Parasternal Heave +ve B.Sounds, Abd Soft, Non tender, No organomegaly appriciated, No rebound -guarding or rigidity. No Cyanosis, Clubbing or edema, No new Rash or bruise, Rt arm in bandage but redness and warmth almost resolved  Data Review      CBC w Diff:  Lab Results  Component Value Date   WBC 11.4* 12/27/2011   HGB 12.4* 12/27/2011   HCT 36.1* 12/27/2011   PLT 376 12/27/2011   LYMPHOPCT 13 12/24/2011   MONOPCT 6 12/24/2011   EOSPCT 0 12/24/2011   BASOPCT 0 12/24/2011   CMP:  Lab Results  Component Value Date   NA 139 12/27/2011   K 4.0 12/28/2011   CL 100 12/27/2011   CO2 30 12/27/2011   BUN 3* 12/27/2011   CREATININE 0.60 12/27/2011   PROT 6.1 12/24/2011   ALBUMIN 2.6* 12/24/2011   BILITOT 0.2* 12/24/2011   ALKPHOS 115 12/24/2011   AST 18 12/24/2011   ALT 25 12/24/2011  .  Micro Results Recent Results (from the past 240  hour(s))  CULTURE, BLOOD (ROUTINE X 2)     Status: Normal (Preliminary result)   Collection Time   12/23/11  7:25 PM      Component Value Range Status Comment   Specimen Description BLOOD HAND LEFT   Final    Special Requests BOTTLES DRAWN AEROBIC ONLY 5CC   Final    Culture  Setup Time [id number]   Final    Culture     Final    Value:        BLOOD CULTURE RECEIVED NO GROWTH TO DATE CULTURE WILL BE HELD FOR 5 DAYS BEFORE ISSUING A FINAL NEGATIVE REPORT   Report Status PENDING   Incomplete   CULTURE, BLOOD (ROUTINE X 2)     Status: Normal (Preliminary result)   Collection Time   12/23/11  7:36 PM      Component Value Range Status Comment   Specimen Description BLOOD ARM  LEFT   Final    Special Requests     Final    Value: BOTTLES DRAWN AEROBIC AND ANAEROBIC AERO 10CC, ANAE 8CC   Culture  Setup Time [id number]   Final    Culture     Final    Value:        BLOOD CULTURE RECEIVED NO GROWTH TO DATE CULTURE WILL BE HELD FOR 5 DAYS BEFORE ISSUING A FINAL NEGATIVE REPORT   Report Status PENDING   Incomplete   CULTURE, ROUTINE-ABSCESS     Status: Normal   Collection Time   12/23/11  9:56 PM      Component Value Range Status Comment   Specimen Description ABSCESS HAND RIGHT   Final    Special Requests PT ON BACTRUM,CLEOCIN,VANC   Final    Gram Stain     Final    Value: RARE WBC PRESENT,BOTH PMN AND MONONUCLEAR     NO ORGANISMS SEEN     Performed at [facility]   Culture FEW GROUP A STREP (S.PYOGENES) ISOLATED   Final    Report Status 12/26/2011 FINAL   Final   ANAEROBIC CULTURE     Status: Normal (Preliminary result)   Collection Time   12/23/11  9:56 PM      Component Value Range Status Comment   Specimen Description ABSCESS HAND RIGHT   Final    Special Requests PT ON BACTRUM,CLEOCIN,VANC   Final    Gram Stain PENDING   Incomplete    Culture     Final    Value: NO ANAEROBES ISOLATED; CULTURE IN PROGRESS FOR 5 DAYS   Report Status PENDING   Incomplete   GRAM STAIN     Status: Normal   Collection Time   12/23/11  9:56 PM      Component Value Range Status Comment   Specimen Description ABSCESS HAND RIGHT   Final    Special Requests PT ON BACTRUM,CLEOCIN,VANC   Final    Gram Stain     Final    Value: RARE WBC PRESENT,BOTH PMN AND MONONUCLEAR     NO ORGANISMS SEEN   Report Status 12/23/2011 FINAL   Final   SURGICAL PCR SCREEN     Status: Normal   Collection Time   12/25/11  6:50 AM      Component Value Range Status Comment   MRSA, PCR NEGATIVE  NEGATIVE  Final    Staphylococcus aureus NEGATIVE  NEGATIVE  Final   GRAM STAIN     Status: Normal   Collection Time   12/25/11  4:58 PM      Component Value Range Status Comment   Specimen  Description ABSCESS HAND RIGHT   Final    Special Requests NONE   Final    Gram Stain     Final    Value: MODERATE WBC PRESENT,BOTH PMN AND MONONUCLEAR     NO ORGANISMS SEEN     Gram Stain Report Called to,Read Back By and Verified With: Mendel Corning RN 17:40 12/25/11 ([email or web address])   Report Status 12/25/2011 FINAL   Final   CULTURE, ROUTINE-ABSCESS     Status: Normal   Collection Time   12/25/11  4:58 PM      Component Value Range Status Comment   Specimen Description ABSCESS HAND RIGHT   Final    Special Requests NONE   Final    Gram Stain     Final    Value: MODERATE WBC PRESENT,BOTH PMN AND MONONUCLEAR     NO ORGANISMS SEEN     Performed at [facility] Gram Stain Report Called to,Read Back By and Verified With: Gram Stain Report Called to,Read Back By and Verified With: Mendel Corning RN [phone] 12/25/11 WILSONM   Culture NO GROWTH 3 DAYS   Final    Report Status 12/29/2011 FINAL   Final   ANAEROBIC CULTURE     Status: Normal (Preliminary result)   Collection Time   12/25/11  4:58 PM      Component Value Range Status Comment   Specimen Description ABSCESS HAND RIGHT   Final    Special Requests NONE   Final    Gram Stain     Final    Value: MODERATE WBC PRESENT,BOTH PMN AND MONONUCLEAR     NO ORGANISMS SEEN     Performed at [facility] Gram Stain Report Called to,Read Back By and Verified With: Gram Stain Report Called to,Read Back By and Verified With: Mendel Corning RN [phone] 12/25/11 WILSONM   Culture     Final    Value: NO ANAEROBES ISOLATED; CULTURE IN PROGRESS FOR 5 DAYS   Report Status PENDING   Incomplete   ANAEROBIC CULTURE     Status: Normal (Preliminary result)   Collection Time   12/27/11 12:41 AM      Component Value Range Status Comment   Specimen Description ABSCESS ARM RIGHT   Final    Special Requests NONE   Final    Gram Stain PENDING   Incomplete    Culture     Final    Value: NO ANAEROBES ISOLATED; CULTURE IN PROGRESS FOR 5 DAYS   Report Status PENDING   Incomplete    CULTURE, ROUTINE-ABSCESS     Status: Normal (Preliminary result)   Collection Time   12/27/11 12:41 AM      Component Value Range Status Comment   Specimen Description ABSCESS ARM RIGHT   Final    Special Requests NONE   Final    Gram Stain     Final    Value: NO WBC SEEN     NO ORGANISMS SEEN     Performed at [facility]   Culture NO GROWTH 2 DAYS   Final    Report Status PENDING   Incomplete   GRAM STAIN     Status: Normal   Collection Time   12/27/11 12:41 AM      Component Value Range Status Comment   Specimen Description ABSCESS ARM RIGHT   Final    Special Requests NONE  Final    Gram Stain     Final    Value: NO ORGANISMS SEEN     NO WBC SEEN   Report Status 12/27/2011 FINAL   Final      Discharge Instructions     Keep right arm clean and dry.  Follow with Primary MD in 7 days   Get CBC, CMP, checked 7 days by Primary MD and again as instructed by your Primary MD.    Get Medicines reviewed and adjusted.  Please request your Prim.MD to go over all Hospital Tests and Procedure/Radiological results at the follow up, please get all Hospital records sent to your Prim MD by signing hospital release before you go home.  Activity: Fall precautions use walker/cane & assistance as needed  Diet: Heart Healthy   Disposition Home  If you experience worsening of your admission symptoms, develop shortness of breath, life threatening emergency, suicidal or homicidal thoughts you must seek medical attention immediately by calling 911 or calling your MD immediately  if symptoms less severe.  You Must read complete instructions/literature along with all the possible adverse reactions/side effects for all the Medicines you take and that have been prescribed to you. Take any new Medicines after you have completely understood and accpet all the possible adverse reactions/side effects.   Do not drive if your were admitted for syncope or siezures until you have seen by  Primary MD or a Neurologist and advised to drive.  Do not drive when taking Pain medications.    Do not take more than prescribed Pain, Sleep and Anxiety Medications  Special Instructions: If you have smoked or chewed Tobacco  in the last 2 yrs please stop smoking, stop any regular Alcohol  and or any Recreational drug use.  Wear Seat belts while driving.  Hector Parks was admitted to the Hospital on 12/23/2011 and Discharged on Discharge Date 12/29/2011 and should be excused from work/school   for 14  days starting 12/23/2011 , may return to work/school without any restrictions.  Call Susa Raring MD, [facility] [phone] with questions.  Leroy Sea M.D on 12/29/2011,at 10:57 AM  [facility] Office  [phone]   Follow-up Information    Follow up with [facility], MD. Schedule an appointment as soon as possible for a visit in 1 day.   Contact information:   [address] [place] [address] [phone]       Follow up with DEFAULT,PROVIDER, MD. Schedule an appointment as soon as possible for a visit in 1 week.   Contact information:   [address] [place] [address] [phone]       Follow up with Acey Lav, MD. Schedule an appointment as soon as possible for a visit in 1 week.   Contact information:   [address] [place] [address] [phone]          Discharge Medications   Medication List  As of 12/29/2011 10:57 AM   START taking these medications         ALPRAZolam 0.5 MG tablet   Commonly known as: XANAX   Take 1 tablet (0.5 mg total) by mouth at bedtime as needed for sleep or anxiety.      amoxicillin 500 MG capsule   Commonly known as: AMOXIL   Take 1 capsule (500 mg total) by mouth 3 (three) times daily.      HYDROcodone-acetaminophen 7.5-500 MG per tablet  Commonly known as: LORTAB   Take 1 tablet by mouth every 8 (eight)  hours as needed for pain.         CONTINUE taking these medications         escitalopram 20 MG tablet   Commonly known as: LEXAPRO         STOP taking these medications         sulfamethoxazole-trimethoprim 800-160 MG per tablet          Where to get your medications    These are the prescriptions that you need to pick up.   You may get these medications from any pharmacy.         ALPRAZolam 0.5 MG tablet   amoxicillin 500 MG capsule   HYDROcodone-acetaminophen 7.5-500 MG per tablet             Total Time in preparing paper work, data evaluation and todays exam - 35 minutes  Leroy Sea M.D on 12/29/2011 at 10:57 AM  [facility] Office  [phone]

## 2011-12-30 LAB — CULTURE, ROUTINE-ABSCESS: Culture: NO GROWTH

## 2011-12-30 LAB — ANAEROBIC CULTURE

## 2011-12-30 LAB — CULTURE, BLOOD (ROUTINE X 2)
Culture  Setup Time: 201304230413
Culture  Setup Time: 201304230413
Culture: NO GROWTH
Culture: NO GROWTH

## 2012-01-01 LAB — ANAEROBIC CULTURE: Gram Stain: NONE SEEN

## 2012-01-05 ENCOUNTER — Ambulatory Visit: Payer: Self-pay | Admitting: Emergency Medicine

## 2012-01-05 ENCOUNTER — Telehealth: Payer: Self-pay

## 2012-01-05 VITALS — BP 130/87 | HR 121 | Temp 97.8°F | Resp 16 | Ht 68.0 in | Wt 170.0 lb

## 2012-01-05 DIAGNOSIS — L0390 Cellulitis, unspecified: Secondary | ICD-10-CM

## 2012-01-05 DIAGNOSIS — L0291 Cutaneous abscess, unspecified: Secondary | ICD-10-CM

## 2012-01-05 LAB — COMPREHENSIVE METABOLIC PANEL
ALT: 34 U/L (ref 0–53)
AST: 23 U/L (ref 0–37)
Albumin: 4.2 g/dL (ref 3.5–5.2)
Calcium: 9.6 mg/dL (ref 8.4–10.5)
Chloride: 103 mEq/L (ref 96–112)
Potassium: 4.3 mEq/L (ref 3.5–5.3)
Sodium: 140 mEq/L (ref 135–145)
Total Protein: 7.2 g/dL (ref 6.0–8.3)

## 2012-01-05 LAB — POCT CBC
Hemoglobin: 14 g/dL — AB (ref 14.1–18.1)
MPV: 8.4 fL (ref 0–99.8)
POC Granulocyte: 1.9 — AB (ref 2–6.9)
POC MID %: 6.7 %M (ref 0–12)
RBC: 4.77 M/uL (ref 4.69–6.13)
WBC: 5 10*3/uL (ref 4.6–10.2)

## 2012-01-05 MED ORDER — HYDROCODONE-ACETAMINOPHEN 7.5-500 MG PO TABS: 1.0000 | ORAL_TABLET | Freq: Four times a day (QID) | ORAL | Status: AC | PRN

## 2012-01-05 NOTE — Telephone Encounter (Addendum)
Pt saw dr Cleta Alberts today for a hand issue. Pt wants to know if dr Cleta Alberts could prescribe lexapro 20 mg (generic) Pt has been receiving rx from psyc dr, but is going to use [facility] as pcp now.  [facility] [address] [address]

## 2012-01-05 NOTE — Progress Notes (Signed)
  Subjective:    Patient ID: Hector Parks, male    DOB: 1976/01/29, 36 y.o.   MRN: [id number]  HPI this is a followup for dressing change for complicated patient. The patient presented to the emergency room with a cellulitis of his arm. And subsequently required 3 separate fasciotomies. Culture subsequently grew strep. Is currently on outpatient treatment with antibiotics and dressing changes. He has regular appointments with his hand surgeon as well as has a followup appointment with Dr. Algis Liming infectious disease.    Review of Systems     Objective:   Physical Exam dressing was removed. He has 3 fasciotomies shot sites the one over the dorsum of the he and one over the volar surface of the right wrist and one over the inside of the upper arm. All these areas are clear of infection. Sterile saline dressings were applied to each of these followed by [place] sterile wrap. Patient tolerated the procedure well.        Assessment & Plan:  Status post 3 fasciotomies for strep cellulitis of the upper extremity. Patient is currently on by mouth antibiotics. He has followup appointments with his hand surgeon on Tuesday and a followup appointment with infectious disease later this week.

## 2012-01-06 ENCOUNTER — Other Ambulatory Visit: Payer: Self-pay

## 2012-01-06 NOTE — Telephone Encounter (Signed)
LMOM TO CB 

## 2012-01-06 NOTE — Telephone Encounter (Signed)
Please call patient. It would be fine to put him on Lexapro 20 one a day #30 with 3 refills. I will give him enough time to come in and talk to me about that prescription.

## 2012-01-06 NOTE — Telephone Encounter (Signed)
Pts wife had called and LM on RN VM asking for CB about pt. Called pt back directly since did not have permission on HIPPA to speak w/wife. Pt was very nice and stated that he had been in to see Dr Cleta Alberts and was transferring care to our practice bc he did not have a PCP. He stated that he had reported that he takes Lexapro 20 QD at [facility] but did not discuss w/Dr Cleta Alberts. He no longer needs to see his psychiatrist and wondered if Dr Cleta Alberts would write his RX for Lexapro. He runs out after tomorrow and knows that Dr Cleta Alberts will not be back in office until then. Even if Dr Cleta Alberts could RF for 1 mos and then pt could RTC to discuss if needed, that would be a help.

## 2012-01-07 MED ORDER — CITALOPRAM HYDROBROMIDE 20 MG PO TABS: 20.0000 mg | ORAL_TABLET | Freq: Every day | ORAL | Status: DC

## 2012-01-07 NOTE — Telephone Encounter (Signed)
Pt reported that he was mistaken, and he is taking citalopram 20 QD, not Lexapro. Sent in Rx for his [facility]opram to [facility] after verifying pharm w/pt. Pt thanked Dr Cleta Alberts for RFing and pt will get in to see him to discuss bf RFs run out.

## 2012-01-13 ENCOUNTER — Telehealth: Payer: Self-pay

## 2012-01-13 NOTE — Telephone Encounter (Signed)
DONNA STATES THEY TOLD THE DR THAT HER HUSBAND TAKES 10MG  OF CITALOPRAM BUT IT IS 40MG S INSTEAD, REALIZED THEY MADE THE MISTAKE, BUT WOULD LIKE [place] TO CALL IT IN FOR THEM WITH THE UPDATED DOSAGE. PLEASE CALL [phone]   [facility] [address]

## 2012-01-13 NOTE — Telephone Encounter (Signed)
Paper chart requested.

## 2012-01-15 NOTE — Telephone Encounter (Signed)
Pts wife calling again to check the status of the citalopram, she states that they told Dr.Daub to write it for 20mg  instead but the pt has in the past taken 40mg . Please advise.

## 2012-01-15 NOTE — Telephone Encounter (Signed)
I called pharmacy to verify what pt has been taking. They reported that they most recently fill Citalopram 40 mg QD for pt on 11/22/11. Do you want to OK change of your Rx, Dr Cleta Alberts?

## 2012-01-16 ENCOUNTER — Telehealth: Payer: Self-pay

## 2012-01-16 NOTE — Telephone Encounter (Signed)
Call Mr. Hector Parks tell him I would feel better if he was only on 20 mg a day of Celexa. Regarding cardiac irregularities on the higher dose and it would be safer for him to take 20 mg a day

## 2012-01-16 NOTE — Telephone Encounter (Signed)
LMOM for wife to CB. Spoke w/Dr Cleta Alberts about wife's most recent message, and Dr Cleta Alberts suggested that pt taper down to 1 and 1/2 for 2 weeks and then down to 1 tab to get him to the safer 20 mg dose QD.

## 2012-01-16 NOTE — Telephone Encounter (Signed)
The patient's wife called to check status of Rx for citalopram 40 mg.  Please call patient's wife at [phone].  The patient's wife stated that the patient has been taking two 20 mg pills each day as he felt very bad on the lower dose of 20 mg.  [facility] [address]

## 2012-01-17 NOTE — Telephone Encounter (Signed)
Gave wife instr's from Dr Cleta Alberts and she agrees to have pt try this and see how he adjusts. Advised that pt should RTC to discuss changing to different medication if he finds that 20 mg is not effective after tapering down. Wife agrees.

## 2012-01-21 ENCOUNTER — Telehealth: Payer: Self-pay | Admitting: *Deleted

## 2012-01-21 NOTE — Telephone Encounter (Signed)
Call to patient to remind of appt tomorrow with Dr. Daiva Eves. Wendall Mola CMA

## 2012-01-22 ENCOUNTER — Telehealth: Payer: Self-pay

## 2012-01-22 ENCOUNTER — Ambulatory Visit (INDEPENDENT_AMBULATORY_CARE_PROVIDER_SITE_OTHER): Payer: Self-pay | Admitting: Infectious Disease

## 2012-01-22 VITALS — BP 133/89 | HR 66 | Temp 98.0°F | Ht 67.0 in | Wt 174.0 lb

## 2012-01-22 DIAGNOSIS — B950 Streptococcus, group A, as the cause of diseases classified elsewhere: Secondary | ICD-10-CM | POA: Insufficient documentation

## 2012-01-22 DIAGNOSIS — L0390 Cellulitis, unspecified: Secondary | ICD-10-CM

## 2012-01-22 DIAGNOSIS — M79603 Pain in arm, unspecified: Secondary | ICD-10-CM

## 2012-01-22 DIAGNOSIS — M79609 Pain in unspecified limb: Secondary | ICD-10-CM

## 2012-01-22 MED ORDER — HYDROCODONE-ACETAMINOPHEN 5-325 MG PO TABS: 1.0000 | ORAL_TABLET | Freq: Four times a day (QID) | ORAL | Status: AC | PRN

## 2012-01-22 NOTE — Telephone Encounter (Signed)
PT REQUESTING REFILL FOR HYDROCHODONE 7.5,PT WILL PICK UP RX WHEN READY.   BEST PHONE [phone]
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
## 2012-01-22 NOTE — Telephone Encounter (Signed)
Done. No further refills.
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
## 2012-01-22 NOTE — Assessment & Plan Note (Signed)
See above

## 2012-01-22 NOTE — Assessment & Plan Note (Signed)
Resolving. Observe OFF antibiotics and rtc on one month

## 2012-01-23 ENCOUNTER — Other Ambulatory Visit: Payer: Self-pay

## 2012-01-23 ENCOUNTER — Other Ambulatory Visit: Payer: Self-pay | Admitting: Emergency Medicine

## 2012-01-23 MED ORDER — CITALOPRAM HYDROBROMIDE 20 MG PO TABS: ORAL_TABLET | ORAL | Status: DC

## 2012-01-23 MED ORDER — CITALOPRAM HYDROBROMIDE 20 MG PO TABS: 20.0000 mg | ORAL_TABLET | Freq: Every day | ORAL | Status: DC

## 2012-01-23 NOTE — Telephone Encounter (Signed)
Pt requesting Hydrocodone Rx wrote out to last til February 01, 2012.

## 2012-01-23 NOTE — Telephone Encounter (Signed)
Pt's wife CB and stated that pt has been afraid to taper down to 1 and 1/2 of the citalopram and so he hasn't started tapering yet. She request RF of 20 mg w/ sig to take 1 and 1/2 for 2 weeks and then down to 1 QD as Dr Cleta Alberts instr'd on 01/13/12 phone message, and then he will RTC when Dr Cleta Alberts returns to talk about if he should switch to a different medication. Can we do this?

## 2012-01-23 NOTE — Telephone Encounter (Signed)
Notified pt's wife of RF and plan for f/up. She agreed.

## 2012-01-23 NOTE — Telephone Encounter (Signed)
Addended by: Launa Flight B on: 01/23/2012 03:27 PM   Modules accepted: Orders

## 2012-01-23 NOTE — Telephone Encounter (Signed)
Notified pt of Ryan's message. Pt agreed.

## 2012-01-23 NOTE — Telephone Encounter (Signed)
PATIENT'S WIFE CALLED TO GIVE BARBARA JACKSON THIS MESSAGE. SHE STATES HER HUSBAND NEEDS TO GET A REFILL ON HIS CELEXA. SHE SAID BARBARA KNOWS WHAT IS GOING ON WITH THIS REFILL AND TOLD THEM TO CALL BACK WHEN IT WAS TIME TO GET IT FILLED. HE NEEDS TO GET THE 40MG  AND NOT THE 20MG . HE WILL BE OUT COMPLETELY ON Friday. DR. Cleta Alberts IS HIS DOCTOR.  BEST PHONE [phone] (DONNA Clausing - WIFE)    [facility])       MBC

## 2012-01-23 NOTE — Telephone Encounter (Signed)
PT CALLED TO STATE HE IS GOING OUT OF TOWN AND WOULD LIKE TO KNOW THE STATUS OF HIS MEDICATION REFILL. ADVISED PATIENT WE ARE WAITING ON THE TREATING MD TO REVIEW THE REQUEST.

## 2012-01-23 NOTE — Telephone Encounter (Signed)
Sending in RF of citalopram 20 mg 1 and 1/2 QD for pt to cover him until he can get in to see Dr Cleta Alberts to discuss pos change in therapy. Approved by Marylene Land. D/w wife who agreed to plan
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
## 2012-02-17 ENCOUNTER — Telehealth: Payer: Self-pay

## 2012-02-17 NOTE — Telephone Encounter (Signed)
Please call patient he needs to come in and see me and talk about medications. I will be here Wednesday from 9 until 4

## 2012-02-17 NOTE — Telephone Encounter (Signed)
Patients wife would like to know if we can prescribe him something for anxiety and would like [facility] [address].  He has been taking celexa for years.  He will run out of celexa tomorrow.

## 2012-02-17 NOTE — Telephone Encounter (Signed)
DONNA WOULD LIKE TO SPEAK WITH SOMEONE REGARDING HER HUSBAND, DIDN'T WANT TO GIVE MUCH INFORMATION SINCE SHE SPEAKS WITH BARBARA REGARDING PT. PLEASE CALL [phone] OR [phone]

## 2012-02-17 NOTE — Telephone Encounter (Signed)
lmom with info

## 2012-02-18 ENCOUNTER — Telehealth: Payer: Self-pay

## 2012-02-18 MED ORDER — CITALOPRAM HYDROBROMIDE 20 MG PO TABS: ORAL_TABLET | ORAL | Status: DC

## 2012-02-18 NOTE — Telephone Encounter (Signed)
PATIENT CALLING AGAIN REQUESTING A REFILL THAT WILL LAST HIM THROUGH THE BEGINNING OF July, WHICH IS WHEN HIS INS. WILL START.  CANT AFFORD TO COME IN FOR OV.

## 2012-02-18 NOTE — Telephone Encounter (Signed)
Done

## 2012-02-18 NOTE — Telephone Encounter (Signed)
PTS INSURANCE DOES NOT GO INTO EFFECT UNTIL July 1.  REQUESTING A REFILL ON ANXIETY MEDICATION ENOUGH TO GET THROUGH UNTIL AFTER July 1 WHEN HE CAN AFFORD TO COME IN TO SEE DR. DAUB   citalopram (CELEXA) 20 MG tablet  Take one and one half tablets by mouth daily - PATIENT NEEDS OFFICE VISIT FOR ADD'L REFILLS, NormalRefills: 0 ordered [id number] [place], [place] - [address]

## 2012-02-18 NOTE — Telephone Encounter (Signed)
LMOM notifying that rx sent in.

## 2012-02-27 ENCOUNTER — Ambulatory Visit: Payer: Self-pay | Admitting: Infectious Disease

## 2012-03-12 ENCOUNTER — Telehealth: Payer: Self-pay

## 2012-03-12 MED ORDER — CITALOPRAM HYDROBROMIDE 20 MG PO TABS: ORAL_TABLET | ORAL | Status: DC

## 2012-03-12 NOTE — Telephone Encounter (Signed)
Refill sent in. No further refill without office visit

## 2012-03-12 NOTE — Telephone Encounter (Signed)
DONNA STATES HER HUSBAND IS SUPPOSE TO COME BACK IN BEFORE WE RENEW HIS MEDS HOWEVER HIS INSURANCE DOESN'T KICK IN UNTIL THE END OF July WOULD LIKE TO HAVE ANOTHER REFILL UNTIL THEN PLEASE CALL [phone]    [facility] [address]

## 2012-03-12 NOTE — Telephone Encounter (Signed)
Pt's chart is in phone message PA stack (daily 07/02/10). Wanted to check d/t phone message from last month and Dr Ellis Parents OV note that pt was complicated.

## 2012-03-12 NOTE — Telephone Encounter (Signed)
LMOM to call back

## 2012-03-13 ENCOUNTER — Telehealth: Payer: Self-pay

## 2012-03-13 NOTE — Telephone Encounter (Signed)
LMOM on pt's and wife's # to CB

## 2012-03-13 NOTE — Telephone Encounter (Signed)
Wife CB and gave message that Rx was done but absolutely needs OV for add'l. Wife agreed and apologized they had been wrong about when the ins would start. She will CB the last week of July and get Dr Ellis Parents schedule at 102.

## 2012-03-16 NOTE — Telephone Encounter (Signed)
See phone message 03/12/12

## 2012-04-03 ENCOUNTER — Telehealth: Payer: Self-pay
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
## 2012-04-03 NOTE — Telephone Encounter (Signed)
Pt is currently on citalopram (CELEXA) 20 MG tablet [[id number]] prescribed by a physician not in our office. Dr. Cleta Alberts wanted to prescribe something else due to potential heart complications with celexa. Dr. Cleta Alberts is not in this weekend but the patient is in need of a refill and wanted to know if somebody else can prescribe something different for him.  Best [phone]  [id number] [place], [place] - [address]

## 2012-04-05 ENCOUNTER — Other Ambulatory Visit: Payer: Self-pay | Admitting: Physician Assistant

## 2012-04-05 ENCOUNTER — Ambulatory Visit: Payer: Self-pay | Admitting: Family Medicine

## 2012-04-05 VITALS — BP 132/70 | HR 73 | Temp 98.1°F | Resp 16 | Ht 67.75 in | Wt 173.8 lb

## 2012-04-05 DIAGNOSIS — M79641 Pain in right hand: Secondary | ICD-10-CM

## 2012-04-05 DIAGNOSIS — M79609 Pain in unspecified limb: Secondary | ICD-10-CM

## 2012-04-05 MED ORDER — TRAMADOL HCL 50 MG PO TABS: 50.0000 mg | ORAL_TABLET | Freq: Two times a day (BID) | ORAL | Status: AC | PRN

## 2012-04-05 MED ORDER — CITALOPRAM HYDROBROMIDE 20 MG PO TABS: 30.0000 mg | ORAL_TABLET | Freq: Every day | ORAL | Status: DC

## 2012-04-05 MED ORDER — GABAPENTIN 300 MG PO CAPS: 300.0000 mg | ORAL_CAPSULE | Freq: Every day | ORAL | Status: DC

## 2012-04-05 NOTE — Progress Notes (Signed)
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  He is being seen by Dr. Dairl Ponder.  He has been having more pain lately.  O:  Postoperative scars on right hand and arm with scarring on dorsal right hand with relatively full range of motion  There is an open area on scar line dorsal hand without corresponding erythema.  A:  Post-cellulitic surgical changes with pain  P:  followup with Dr. Mina Marble

## 2012-04-30 ENCOUNTER — Telehealth: Payer: Self-pay

## 2012-04-30 NOTE — Telephone Encounter (Signed)
Wife is [facility] [address] to fill RX citalopram (CELEXA) 20 MG tablet  She was advised to contact the new pharmacy to have this done.    [phone] (M)

## 2012-04-30 NOTE — Telephone Encounter (Signed)
Patient has 2 renewals on this , will contact pharmacy for transfer.

## 2012-05-25 ENCOUNTER — Telehealth: Payer: Self-pay

## 2012-05-25 NOTE — Telephone Encounter (Signed)
citalopram (CELEXA) 20 MG tablet Lupita Leash is requesting this med refill on behalf of her spouse.  He is completely out and will get a headache without the medication.  [facility] [address].  Called pharmacy last night -  CBN: [phone]

## 2012-05-25 NOTE — Telephone Encounter (Signed)
I advised a Rx with 2 RF [facility], she is going to contact [facility]. She can get the Rx for $4 at [facility].Marland Kitchen

## 2012-06-18 ENCOUNTER — Telehealth: Payer: Self-pay

## 2012-06-18 ENCOUNTER — Other Ambulatory Visit: Payer: Self-pay | Admitting: Physician Assistant

## 2012-06-18 NOTE — Telephone Encounter (Signed)
Left message to advise Rx sent in today, if she needs further, she will call me back.

## 2012-06-18 NOTE — Telephone Encounter (Signed)
Called left message, advised to have pharmacy send over request for this. If she has questions she will call me back.

## 2012-06-18 NOTE — Telephone Encounter (Signed)
citalopram (CELEXA) 20 MG tablet Request of refill - medicaid only  No money for ov [phone]  Lupita Leash - Spouse

## 2012-06-18 NOTE — Telephone Encounter (Signed)
Pt wife would like for Amy L to contact her concerning pt [phone]

## 2012-07-13 ENCOUNTER — Other Ambulatory Visit: Payer: Self-pay | Admitting: Physician Assistant

## 2012-07-13 NOTE — Telephone Encounter (Signed)
PT STATES IN NEED OF HIS CITALOPRAM. PLEASE CALL [phone]

## 2012-07-21 ENCOUNTER — Ambulatory Visit: Payer: Self-pay | Admitting: Internal Medicine

## 2012-07-21 VITALS — BP 134/78 | HR 60 | Temp 98.0°F | Resp 16 | Ht 68.0 in | Wt 171.0 lb

## 2012-07-21 DIAGNOSIS — M79643 Pain in unspecified hand: Secondary | ICD-10-CM

## 2012-07-21 DIAGNOSIS — M79609 Pain in unspecified limb: Secondary | ICD-10-CM

## 2012-07-21 DIAGNOSIS — M25449 Effusion, unspecified hand: Secondary | ICD-10-CM

## 2012-07-21 DIAGNOSIS — M25441 Effusion, right hand: Secondary | ICD-10-CM

## 2012-07-21 LAB — POCT CBC
Hemoglobin: 16.9 g/dL (ref 14.1–18.1)
MCH, POC: 31.2 pg (ref 27–31.2)
MCHC: 31.8 g/dL (ref 31.8–35.4)
MID (cbc): 0.5 (ref 0–0.9)
MPV: 9.3 fL (ref 0–99.8)
POC Granulocyte: 3.8 (ref 2–6.9)
POC MID %: 7.3 %M (ref 0–12)
Platelet Count, POC: 323 10*3/uL (ref 142–424)
RBC: 5.41 M/uL (ref 4.69–6.13)

## 2012-07-21 LAB — POCT SEDIMENTATION RATE: POCT SED RATE: 3 mm/hr (ref 0–22)

## 2012-07-21 MED ORDER — AMOXICILLIN-POT CLAVULANATE 875-125 MG PO TABS: 1.0000 | ORAL_TABLET | Freq: Two times a day (BID) | ORAL | Status: DC

## 2012-07-21 NOTE — Progress Notes (Signed)
  Subjective:    Patient ID: Hector Parks, male    DOB: 14-Jul-1976, 36 y.o.   MRN: [id number]  HPI Hand swelled and hurt today Worried about infection returning Has no reddness or fever. Doing wound infection prevention at home   Review of Systems     Objective:   Physical Exam Right hand tender but no erythema, fluctuance, or induration NMSV fully intact  Results for orders placed in visit on 07/21/12  POCT CBC      Component Value Range   WBC 7.2  4.6 - 10.2 K/uL   Lymph, poc 2.8  0.6 - 3.4   POC LYMPH PERCENT 39.3  10 - 50 %L   MID (cbc) 0.5  0 - 0.9   POC MID % 7.3  0 - 12 %M   POC Granulocyte 3.8  2 - 6.9   Granulocyte percent 53.4  37 - 80 %G   RBC 5.41  4.69 - 6.13 M/uL   Hemoglobin 16.9  14.1 - 18.1 g/dL   HCT, POC 78.2  95.6 - 53.7 %   MCV 98.2 (*) 80 - 97 fL   MCH, POC 31.2  27 - 31.2 pg   MCHC 31.8  31.8 - 35.4 g/dL   RDW, POC 21.3     Platelet Count, POC 323  142 - 424 K/uL   MPV 9.3  0 - 99.8 fL        Assessment & Plan:  Augmentin 875 20/bid

## 2012-07-21 NOTE — Patient Instructions (Signed)
Strep Infections  Streptococcal (strep) infections are caused by streptococcal germs (bacteria). Strep infections are very contagious. Strep infections can occur in:   Ears.   The nose.   The throat.   Sinuses.   Skin.   Blood.   Lungs.   Spinal fluid.   Urine.  Strep throat is the most common bacterial infection in children. The symptoms of a Strep infection usually get better in 2 to 3 days after starting medicine that kills germs (antibiotics). Strep is usually not contagious after 36 to 48 hours of antibiotic treatment. Strep infections that are not treated can cause serious complications. These include gland infections, throat abscess, rheumatic fever and kidney disease.  DIAGNOSIS   The diagnosis of strep is made by:   A culture for the strep germ.  TREATMENT   These infections require oral antibiotics for a full 10 days, an antibiotic shot or antibiotics given into the vein (intravenous, IV).  HOME CARE INSTRUCTIONS    Be sure to finish all antibiotics even if feeling better.   Only take over-the-counter medicines for pain, discomfort and or fever, as directed by your caregiver.   Close contacts that have a fever, sore throat or illness symptoms should see their caregiver right away.   You or your child may return to work, school or daycare if the fever and pain are better in 2 to 3 days after starting antibiotics.  SEEK MEDICAL CARE IF:    You or your child has an oral temperature above 102 F (38.9 C).   Your baby is older than 3 months with a rectal temperature of 100.5 F (38.1 C) or higher for more than 1 day.   You or your child is not better in 3 days.  SEEK IMMEDIATE MEDICAL CARE IF:    You or your child has an oral temperature above 102 F (38.9 C), not controlled by medicine.   Your baby is older than 3 months with a rectal temperature of 102 F (38.9 C) or higher.   Your baby is 3 months old or younger with a rectal temperature of 100.4 F (38 C) or higher.   There is a  spreading rash.   There is difficulty swallowing or breathing.   There is increased pain or swelling.  Document Released: 09/26/2004 Document Revised: 11/11/2011 Document Reviewed: 07/05/2009  Exit[employer] Patient Information 2013 [employer].

## 2012-08-02 ENCOUNTER — Telehealth: Payer: Self-pay

## 2012-08-02 MED ORDER — CITALOPRAM HYDROBROMIDE 20 MG PO TABS: 30.0000 mg | ORAL_TABLET | Freq: Every day | ORAL | Status: DC

## 2012-08-02 NOTE — Telephone Encounter (Signed)
For future please call the pharmacy for Rx refills.  He needs to have an ov.

## 2012-08-02 NOTE — Telephone Encounter (Signed)
Pt is needing a refill on celexa   Best number [phone]   [facility] [address]

## 2012-08-02 NOTE — Telephone Encounter (Signed)
Advised pt wife of notes and that rx was sent in

## 2012-08-04 ENCOUNTER — Telehealth: Payer: Self-pay

## 2012-08-04 NOTE — Telephone Encounter (Signed)
PTS WIFE DONNA Dauria STATES THAT SHE WANTED THE RX FOR CITALOPRAM SENT OVER TO [facility] [address] SINCE IT WILL BE CHEAPER THERE. BEST# [phone]

## 2012-08-04 NOTE — Telephone Encounter (Signed)
Advised patient Hector Parks will call to get Rx transferred to them form [facility].

## 2012-08-27 ENCOUNTER — Other Ambulatory Visit: Payer: Self-pay | Admitting: Physician Assistant

## 2012-08-27 NOTE — Telephone Encounter (Signed)
Dr. Cleta Alberts agreed to Rx this-previously Rx'd by psychiatrist, but patient indicated by PHONE that he was planning on [facility] serving as PCP.  Dr. Cleta Alberts filled it and advised him to RTC to discuss further.  The patient has been in twice since and seen other providers, and not discussed this issue.

## 2012-09-18 ENCOUNTER — Other Ambulatory Visit: Payer: Self-pay | Admitting: Physician Assistant

## 2012-09-19 ENCOUNTER — Telehealth: Payer: Self-pay

## 2012-10-06 ENCOUNTER — Telehealth: Payer: Self-pay

## 2012-10-06 MED ORDER — CITALOPRAM HYDROBROMIDE 20 MG PO TABS: 30.0000 mg | ORAL_TABLET | Freq: Every day | ORAL | Status: DC

## 2012-10-06 NOTE — Telephone Encounter (Signed)
Wife called and requests RF of pt's citalopram. She stated that someone here had advised her that pt needs OV for med RFs, but pt had only gotten #30 last month instead of the #45 he usually gets for 30 day supply. She requests RF of #45 to give them time to save money to RTC next month. Sending in one month supply to [facility].

## 2012-10-13 ENCOUNTER — Ambulatory Visit: Payer: Self-pay | Admitting: Physician Assistant

## 2012-10-13 VITALS — BP 134/82 | HR 126 | Temp 102.3°F | Resp 17 | Ht 68.0 in | Wt 171.0 lb

## 2012-10-13 DIAGNOSIS — K047 Periapical abscess without sinus: Secondary | ICD-10-CM

## 2012-10-13 DIAGNOSIS — J111 Influenza due to unidentified influenza virus with other respiratory manifestations: Secondary | ICD-10-CM

## 2012-10-13 DIAGNOSIS — R6889 Other general symptoms and signs: Secondary | ICD-10-CM

## 2012-10-13 LAB — POCT CBC
Granulocyte percent: 74.9 %G (ref 37–80)
MCH, POC: 30.8 pg (ref 27–31.2)
MID (cbc): 0.5 (ref 0–0.9)
MPV: 9.1 fL (ref 0–99.8)
POC MID %: 8.6 %M (ref 0–12)
Platelet Count, POC: 238 10*3/uL (ref 142–424)
RBC: 5.26 M/uL (ref 4.69–6.13)
RDW, POC: 13.6 %

## 2012-10-13 MED ORDER — AMOXICILLIN 875 MG PO TABS: 875.0000 mg | ORAL_TABLET | Freq: Two times a day (BID) | ORAL | Status: DC

## 2012-10-13 MED ORDER — HYDROCODONE-ACETAMINOPHEN 5-325 MG PO TABS: 1.0000 | ORAL_TABLET | Freq: Four times a day (QID) | ORAL | Status: DC | PRN

## 2012-10-13 NOTE — Progress Notes (Signed)
[address] [place] [address]   Phone [phone]  Subjective:    Patient ID: Hector Parks, male    DOB: 1976/04/06, 37 y.o.   MRN: [id number]  HPI  Pt presents to clinic with 2 day h/o myalgias, headaches and dry cough.  Symptoms are not getting better, in fact he feels worse.  He also is worries he has a tooth infection and is scheduled to see the dentist next week.  He knows he has multiple teeth that he needs fixed.  He is more worried about the tooth because of his past h/o strep infection in his arm over the last year.  He has been using some tylenol/motrin for the fever and the tooth pain.  He did not get his flu vaccine but he has not had exposure to people with known flu.    Review of Systems     Objective:   Physical Exam  Vitals reviewed. Constitutional: He is oriented to person, place, and time. He appears well-developed and well-nourished.  HENT:  Head: Normocephalic and atraumatic.  Right Ear: Hearing, tympanic membrane, external ear and ear canal normal.  Left Ear: Hearing, tympanic membrane, external ear and ear canal normal.  Nose: Nose normal.  Mouth/Throat: Oropharynx is clear and moist. Abnormal dentition. Dental abscesses (L lower back molar - pus expressed from outside gumline) and dental caries (multiple) present. No oropharyngeal exudate.  Eyes: Conjunctivae are normal.  Neck: Neck supple.  Cardiovascular: Normal rate, regular rhythm and normal heart sounds.   Pulmonary/Chest: Effort normal and breath sounds normal.  Lymphadenopathy:    He has no cervical adenopathy.  Neurological: He is alert and oriented to person, place, and time.  Skin: Skin is warm and dry.  Psychiatric: He has a normal mood and affect. His behavior is normal. Judgment and thought content normal.   Results for orders placed in visit on 10/13/12  POCT INFLUENZA A/B      Result Value Range   Influenza A, POC Negative     Influenza B, POC Negative    POCT CBC      Result  Value Range   WBC 5.8  4.6 - 10.2 K/uL   Lymph, poc 1.0  0.6 - 3.4   POC LYMPH PERCENT 16.5  10 - 50 %L   MID (cbc) 0.5  0 - 0.9   POC MID % 8.6  0 - 12 %M   POC Granulocyte 4.3  2 - 6.9   Granulocyte percent 74.9  37 - 80 %G   RBC 5.26  4.69 - 6.13 M/uL   Hemoglobin 16.2  14.1 - 18.1 g/dL   HCT, POC 21.3  08.6 - 53.7 %   MCV 93.9  80 - 97 fL   MCH, POC 30.8  27 - 31.2 pg   MCHC 32.8  31.8 - 35.4 g/dL   RDW, POC 57.8     Platelet Count, POC 238  142 - 424 K/uL   MPV 9.1  0 - 99.8 fL    Assessment & Plan:   1. Flu-like symptoms  POCT Influenza A/B   POCT Influenza A/B  2. Tooth abscess  POCT CBC   POCT CBC   amoxicillin (AMOXIL) 875 MG tablet   HYDROcodone-acetaminophen (NORCO/VICODIN) 5-325 MG per tablet   Due to pt's history of severe arm infection we are reassured that his WBC is normal but due to pus expressed from the gumline will cover with amoxil until he can get into the dentist.  I really think that the patient has the flu, he declined tamiflu so he will use medications for symptomatic treatment.  Pt to gargle with salt water.  If he is not feeling better  In 48h RTC.  Rest, push fluids, motrin for myalgias.

## 2012-10-28 ENCOUNTER — Telehealth: Payer: Self-pay

## 2012-10-28 NOTE — Telephone Encounter (Signed)
Called wife, what is his follow up plan? He has been on this for 5 yrs. And is fine on this. He was here on 2/11 for unrelated issues, this was not discussed.

## 2012-10-28 NOTE — Telephone Encounter (Signed)
Wife is requesting refills for spouse citalopram (CELEXA) 20 MG tablet [facility] pharmacy:   [facility]; [phone]

## 2012-10-30 ENCOUNTER — Other Ambulatory Visit: Payer: Self-pay | Admitting: *Deleted

## 2012-10-30 MED ORDER — CITALOPRAM HYDROBROMIDE 20 MG PO TABS: 30.0000 mg | ORAL_TABLET | Freq: Every day | ORAL | Status: DC

## 2012-10-30 NOTE — Telephone Encounter (Signed)
Called to advise, no answer.

## 2012-10-30 NOTE — Telephone Encounter (Signed)
90-day supply sent.  Needs OV to discuss this for more.  Meds ordered this encounter  Medications  . citalopram (CELEXA) 20 MG tablet    Sig: Take 1.5 tablets (30 mg total) by mouth daily. Need office visit for additional refills.    Dispense:  135 tablet    Refill:  0

## 2012-10-30 NOTE — Telephone Encounter (Signed)
PT'S WIFE CALLED AGAIN.  JUST REQUESTING THAT WE REFILL THIS.

## 2013-01-04 ENCOUNTER — Other Ambulatory Visit: Payer: Self-pay | Admitting: Physician Assistant

## 2013-01-28 ENCOUNTER — Telehealth: Payer: Self-pay

## 2013-01-28 NOTE — Telephone Encounter (Signed)
Wife called and LM on my VM regarding pt's Rx for citalopram. She stated that he has been very stable on this med for over 5 years and doesn't get health insurance until Jan. We have denied RFs of this d/t his needing an OV. Wife states that he has been in several times over the last few months (seen for other acute issues) and since he is having no problems and has been on the medication for so many years, requests RFs until he can afford to come in. Please advise.

## 2013-01-28 NOTE — Telephone Encounter (Signed)
Wife requests RFs to [facility] at [facility].

## 2013-01-30 ENCOUNTER — Other Ambulatory Visit: Payer: Self-pay | Admitting: Physician Assistant

## 2013-01-30 ENCOUNTER — Telehealth: Payer: Self-pay

## 2013-01-30 NOTE — Telephone Encounter (Signed)
Pts wife Hector Parks is calling to see if the prescription that Hector Parks was working on yesterday was sent over. She is on the HIPPA form [facility] [address]

## 2013-01-31 NOTE — Telephone Encounter (Signed)
Sent with 5 refills

## 2013-02-01 NOTE — Telephone Encounter (Signed)
lmom that rx was sent into pharmacy 

## 2013-02-01 NOTE — Telephone Encounter (Signed)
lmom that rx was sent in

## 2013-02-02 ENCOUNTER — Other Ambulatory Visit: Payer: Self-pay | Admitting: Radiology

## 2013-02-02 NOTE — Telephone Encounter (Signed)
Refill request/ has already been done.

## 2013-05-26 ENCOUNTER — Other Ambulatory Visit: Payer: Self-pay | Admitting: Physician Assistant

## 2013-05-26 NOTE — Telephone Encounter (Signed)
Please advise the patient that he needs an OV.  He was last seen in 10/2012, and has only been seen for acute visits since we started on [facility] 10/01/2011.  Meds ordered this encounter  Medications  . citalopram (CELEXA) 20 MG tablet    Sig: Take 1.5 tablets (30 mg total) by mouth daily. Need office visit for additional refills.    Dispense:  45 tablet    Refill:  0    CYCLE FILL MEDICATION. Authorization is required for next refill.

## 2013-07-05 ENCOUNTER — Ambulatory Visit: Payer: Self-pay | Admitting: Family Medicine

## 2013-07-05 ENCOUNTER — Ambulatory Visit: Payer: Self-pay

## 2013-07-05 VITALS — BP 120/80 | HR 93 | Temp 98.4°F | Resp 16 | Ht 67.5 in | Wt 157.0 lb

## 2013-07-05 DIAGNOSIS — R109 Unspecified abdominal pain: Secondary | ICD-10-CM

## 2013-07-05 DIAGNOSIS — R079 Chest pain, unspecified: Secondary | ICD-10-CM

## 2013-07-05 DIAGNOSIS — M940 Chondrocostal junction syndrome [Tietze]: Secondary | ICD-10-CM

## 2013-07-05 LAB — POCT CBC
Granulocyte percent: 66.6 %G (ref 37–80)
HCT, POC: 49.9 % (ref 43.5–53.7)
Hemoglobin: 16.6 g/dL (ref 14.1–18.1)
Lymph, poc: 2.8 (ref 0.6–3.4)
MCH, POC: 32.2 pg — AB (ref 27–31.2)
MCHC: 33.3 g/dL (ref 31.8–35.4)
MCV: 96.9 fL (ref 80–97)
MID (cbc): 0.6 (ref 0–0.9)
MPV: 9.5 fL (ref 0–99.8)
POC Granulocyte: 6.8 (ref 2–6.9)
POC LYMPH PERCENT: 27.5 % (ref 10–50)
POC MID %: 5.9 %M (ref 0–12)
Platelet Count, POC: 234 10*3/uL (ref 142–424)
RBC: 5.15 M/uL (ref 4.69–6.13)
RDW, POC: 13.4 %
WBC: 10.2 10*3/uL (ref 4.6–10.2)

## 2013-07-05 MED ORDER — HYDROCODONE-ACETAMINOPHEN 5-325 MG PO TABS: 1.0000 | ORAL_TABLET | Freq: Four times a day (QID) | ORAL | Status: DC | PRN

## 2013-07-05 NOTE — Patient Instructions (Signed)
Costochondritis Costochondritis (Tietze syndrome), or costochondral separation, is a swelling and irritation (inflammation) of the tissue (cartilage) that connects your ribs with your breastbone (sternum). It may occur on its own (spontaneously), through damage caused by an accident (trauma), or simply from coughing or minor exercise. It may take up to 6 weeks to get better and longer if you are unable to be conservative in your activities. HOME CARE INSTRUCTIONS   Avoid exhausting physical activity. Try not to strain your ribs during normal activity. This would include any activities using chest, belly (abdominal), and side muscles, especially if heavy weights are used.  Use ice for 15-20 minutes per hour while awake for the first 2 days. Place the ice in a plastic bag, and place a towel between the bag of ice and your skin.  Only take over-the-counter or prescription medicines for pain, discomfort, or fever as directed by your caregiver. SEEK IMMEDIATE MEDICAL CARE IF:   Your pain increases or you are very uncomfortable.  You have a fever.  You develop difficulty with your breathing.  You cough up blood.  You develop worse chest pains, shortness of breath, sweating, or vomiting.  You develop new, unexplained problems (symptoms). MAKE SURE YOU:   Understand these instructions.  Will watch your condition.  Will get help right away if you are not doing well or get worse. Document Released: 05/29/2005 Document Revised: 11/11/2011 Document Reviewed: 04/06/2008 ExitCare Patient Information 2014 [employer].  

## 2013-07-05 NOTE — Progress Notes (Signed)
[facility]:  Office Visit  Chief Complaint:  Chief Complaint  Patient presents with  . Knot    on chest x a few weeks    HPI: Hector Parks is a 37 y.o. male who  present to the office with Abdominal pain and Nausea for 2 weeks.  Prior to the pain, he noticed a knot formed in the mid part of his chest below sternum, in xiphoid area , and it got bigger. The bigger the knot got, the pain intensified. 7-10/10 pain  NO fevers, chills, n/v/rash; No Diarrhea or constipation. The  Patient got OTC Tylenol , but it does not help. The pain is worse  at night time. Sharp consistent pain, feels like it hurts also when he takes deep breath.  He had spont pneumthorax. He had left VATS procedure 2003 [place] x 2 due to complications Seconds to minutes, no fevers, chills.  Deneis SOB, but has pain with deep breathing Works in [employer] and does a lot of lifting for 8 hrs a day, he has pain with movement of his arms in his chest HE is worried he may have spnt pneumo again Declinde flu vaccine Deneis palpitations or diaphoresis No recent travels, low risk for DVT He is a smoker, social drinker, no hsitory of gallstones or hyperlipidemia that he knows of, no diabetes or renal stones  Past Medical History  Diagnosis Date  . Spontaneous pneumothorax   . Anxiety    Past Surgical History  Procedure Laterality Date  . I&d extremity  12/23/2011    Procedure: IRRIGATION AND DEBRIDEMENT EXTREMITY;  Surgeon: Marlowe Shores, MD;  Location: [facility] OR;  Service: Orthopedics;  Laterality: Right;  . I&d extremity  12/25/2011    Procedure: IRRIGATION AND DEBRIDEMENT EXTREMITY;  Surgeon: Marlowe Shores, MD;  Location: [facility] OR;  Service: Orthopedics;  Laterality: Right;  REPEAT I & D RIGHT HAND   . I&d extremity  12/26/2011    Procedure: IRRIGATION AND DEBRIDEMENT EXTREMITY;  Surgeon: Tami Ribas, MD;  Location: [facility] OR;  Service: Orthopedics;  Laterality: Right;  . Spontaneous  pneumothorax  2000  . Strep infection surgery     History   Social History  . Marital Status: Married    Spouse Name: N/A    Number of Children: N/A  . Years of Education: N/A   Social History Main Topics  . Smoking status: Current Every Day Smoker -- 0.50 packs/day for 5 years    Types: Cigarettes  . Smokeless tobacco: None  . Alcohol Use: No  . Drug Use: No  . Sexual Activity: Yes   Other Topics Concern  . None   Social History Narrative  . None   History reviewed. No pertinent family history. No Known Allergies Prior to Admission medications   Medication Sig Start Date End Date Taking? Authorizing Provider  citalopram (CELEXA) 20 MG tablet Take 1.5 tablets (30 mg total) by mouth daily. Need office visit for additional refills. 05/26/13  Yes Chelle S Jeffery, PA-C     ROS: The patient denies fevers,  night sweats, unintentional weight loss,palpitations, wheezing, dyspnea on exertion, nausea, vomiting, abdominal pain, dysuria, hematuria, melena, numbness, weakness, or tingling.   All other systems have been reviewed and were otherwise negative with the exception of those mentioned in the HPI and as above.    PHYSICAL EXAM: Filed Vitals:   07/05/13 1241  BP: 120/80  Pulse: 93  Temp: 98.4 F (36.9 C)  Resp:  16  Spo2 98% Filed Vitals:   07/05/13 1241  Height: 5' 7.5" (1.715 m)  Weight: 157 lb (71.215 kg)   Body mass index is 24.21 kg/(m^2).  General: Alert, no acute distress HEENT:  Normocephalic, atraumatic, oropharynx patent. EOMI, PERRLA, TM nl.  Cardiovascular:  Regular rate and rhythm, no rubs murmurs or gallops.  No Carotid bruits, radial pulse intact. No pedal edema. + chest tenderness with palpation, lifting arms Respiratory: Clear to auscultation bilaterally.  No wheezes, rales, or rhonchi.  No cyanosis, no use of accessory musculature GI: No organomegaly, abdomen is soft and non-tender, positive bowel sounds.  No masses. Skin: No rashes. Neurologic:  Facial musculature symmetric. Psychiatric: Patient is appropriate throughout our interaction. Lymphatic: No cervical lymphadenopathy Musculoskeletal: Gait intact. 5/5 stregnth, 2/2 DTRS   LABS: Results for orders placed in visit on 07/05/13  COMPREHENSIVE METABOLIC PANEL      Result Value Range   Sodium 140  135 - 145 mEq/L   Potassium 4.8  3.5 - 5.3 mEq/L   Chloride 106  96 - 112 mEq/L   CO2 25  19 - 32 mEq/L   Glucose, Bld 84  70 - 99 mg/dL   BUN 15  6 - 23 mg/dL   Creat 1.61  0.96 - 0.45 mg/dL   Total Bilirubin 0.8  0.3 - 1.2 mg/dL   Alkaline Phosphatase 44  39 - 117 U/L   AST 23  0 - 37 U/L   ALT 22  0 - 53 U/L   Total Protein 7.0  6.0 - 8.3 g/dL   Albumin 4.7  3.5 - 5.2 g/dL   Calcium 9.6  8.4 - 40.9 mg/dL  LIPASE      Result Value Range   Lipase 25  0 - 75 U/L  POCT CBC      Result Value Range   WBC 10.2  4.6 - 10.2 K/uL   Lymph, poc 2.8  0.6 - 3.4   POC LYMPH PERCENT 27.5  10 - 50 %L   MID (cbc) 0.6  0 - 0.9   POC MID % 5.9  0 - 12 %M   POC Granulocyte 6.8  2 - 6.9   Granulocyte percent 66.6  37 - 80 %G   RBC 5.15  4.69 - 6.13 M/uL   Hemoglobin 16.6  14.1 - 18.1 g/dL   HCT, POC 81.1  91.4 - 53.7 %   MCV 96.9  80 - 97 fL   MCH, POC 32.2 (*) 27 - 31.2 pg   MCHC 33.3  31.8 - 35.4 g/dL   RDW, POC 78.2     Platelet Count, POC 234  142 - 424 K/uL   MPV 9.5  0 - 99.8 fL     EKG/XRAY:   Primary read interpreted by Dr. Conley Rolls at [facility]. No obvious fx/dislocation Normal  KUB Please comment if there is any free air or pneumo, pt has h/o spont pnemo   ASSESSMENT/PLAN: Encounter Diagnoses  Name Primary?  . Abdominal  pain, other specified site   . Chest pain   . Costochondritis Yes   Most likely MSK in etuiology No pneumo on xray, EKG is WNL KUB Is normal Labs pendign CMP, lipase. Rx Lortab prn for severe pai, otherwise use otc NSAID No insurance till Jan 2015 F/u prn or go to ER prn  Gross sideeffects, risk and benefits, and alternatives of medications d/w  patient. Patient is aware that all medications have potential sideeffects and we are unable to  predict every sideeffect or drug-drug interaction that may occur.  LE, THAO PHUONG, DO 07/06/2013 1:14 PM

## 2013-07-06 ENCOUNTER — Other Ambulatory Visit: Payer: Self-pay | Admitting: Physician Assistant

## 2013-07-06 LAB — COMPREHENSIVE METABOLIC PANEL
ALT: 22 U/L (ref 0–53)
AST: 23 U/L (ref 0–37)
Calcium: 9.6 mg/dL (ref 8.4–10.5)
Chloride: 106 mEq/L (ref 96–112)
Creat: 0.78 mg/dL (ref 0.50–1.35)
Potassium: 4.8 mEq/L (ref 3.5–5.3)
Total Bilirubin: 0.8 mg/dL (ref 0.3–1.2)

## 2013-07-06 LAB — COMPREHENSIVE METABOLIC PANEL WITH GFR
Albumin: 4.7 g/dL (ref 3.5–5.2)
Alkaline Phosphatase: 44 U/L (ref 39–117)
BUN: 15 mg/dL (ref 6–23)
CO2: 25 meq/L (ref 19–32)
Glucose, Bld: 84 mg/dL (ref 70–99)
Sodium: 140 meq/L (ref 135–145)
Total Protein: 7 g/dL (ref 6.0–8.3)

## 2013-07-06 LAB — LIPASE: Lipase: 25 U/L (ref 0–75)

## 2013-07-07 ENCOUNTER — Other Ambulatory Visit: Payer: Self-pay

## 2013-07-07 NOTE — Telephone Encounter (Signed)
Pt's wife called and asked for 6 mos RF of citalopram be sent to [facility] and stated pt was just in a couple of days ago. I advised her that pt was seen for acute issue and this med was not discussed. She asked if we can send in 2 mos of RFs to get him past the first of the year when he will have ins? Can we do this? I have pended it.

## 2013-07-08 ENCOUNTER — Telehealth: Payer: Self-pay

## 2013-07-08 MED ORDER — CITALOPRAM HYDROBROMIDE 20 MG PO TABS: ORAL_TABLET | ORAL | Status: DC

## 2013-07-08 NOTE — Telephone Encounter (Signed)
Can not fax our fax not working. Again. Left message, for call back.

## 2013-07-08 NOTE — Telephone Encounter (Signed)
Dr Conley Rolls, I routing this to you to address the OOW note issue. If you'd like me to send it on to PA pool to address the citalopram since you didn't discuss this with him, I will be glad to.

## 2013-07-08 NOTE — Telephone Encounter (Signed)
Patient needs out of work / bed rest for [employer], Fri, Sat, Sun   Please fax note to [employer] attn:  Weston Brass  [phone]

## 2013-07-08 NOTE — Telephone Encounter (Signed)
Patient wife states he needs an out of work note for Thursday (nov 6) and Friday (nov 7). Please fax to [facility] at [phone]. Cb# [phone] (wife #)

## 2013-07-08 NOTE — Telephone Encounter (Signed)
Hector Parks has refaxed, upstairs.

## 2013-07-08 NOTE — Telephone Encounter (Signed)
Pt wife is calling to check on the status of the fax that Apolonio Schneiders was faxing for her she would like for her to call her when the fax has went through Call back number is [phone]

## 2013-07-12 NOTE — Telephone Encounter (Signed)
Please review request for extended OOW note below. Do you want me to send note out through Sunday?

## 2013-07-13 ENCOUNTER — Telehealth: Payer: Self-pay

## 2013-07-13 DIAGNOSIS — Z0271 Encounter for disability determination: Secondary | ICD-10-CM

## 2013-07-13 NOTE — Telephone Encounter (Signed)
FMLA paperwork in Dr. Irwin Brakeman box to be completed.

## 2013-07-14 NOTE — Telephone Encounter (Signed)
Patient's wife has called to check on FMLA paperwork that was only dropped off yesterday. States that if [employer] doesn't get it by the end of the week he won't get a paycheck and they won't be able to buy groceries and pay for day care. Would like to know when Dr. Conley Rolls can complete the paperwork.

## 2013-07-15 NOTE — Telephone Encounter (Signed)
Pt received notes.

## 2013-07-15 NOTE — Telephone Encounter (Signed)
FMLA forms completed. Faxed copy to HR department at [employer] and left copy in drawer for pickup.

## 2013-07-15 NOTE — Telephone Encounter (Signed)
Thank you Dr. Conley Rolls. I tried explaining that to her several times since I handle most of the FMLA paperwork and she was not happy with my answer and kept repeating that I should just send you a message and see what you say. We have them sign a form that tells them 5-7 business days so we are covered up to that amount of time. You can give me the paperwork when you have a chance to complete it and I will call the patient. Thank you.

## 2013-07-21 NOTE — Telephone Encounter (Signed)
Spoke w/Dr Conley Rolls who OKd extension of OOW note as req'd. Wrote and faxed letter.

## 2013-07-23 ENCOUNTER — Encounter: Payer: Self-pay | Admitting: Family Medicine

## 2013-08-12 IMAGING — CR DG HAND COMPLETE 3+V*R*
3 series · 3 of 3 positions shown · non-contrast
Comparison: No priors.

CLINICAL DATA: Pain and swelling in the right hand and forearm for
the past 4 days, getting worse.

RIGHT HAND - COMPLETE 3+ VIEW

[x hand pa right]
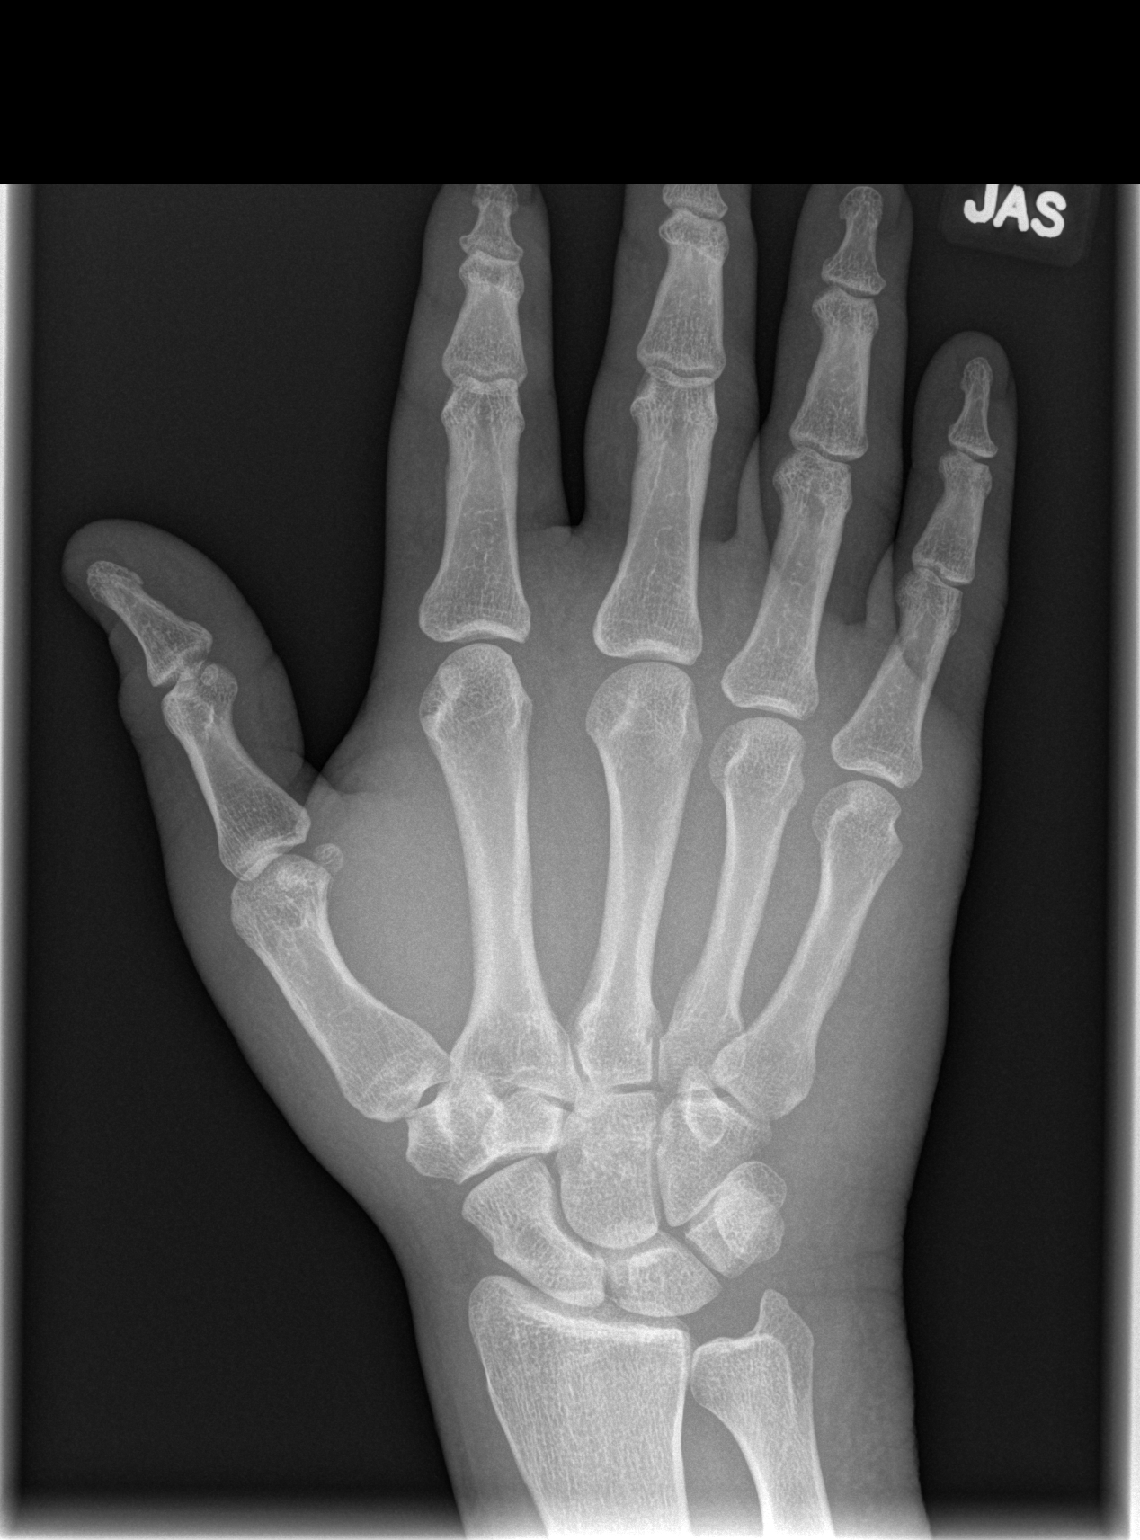

[x hand oblique right]
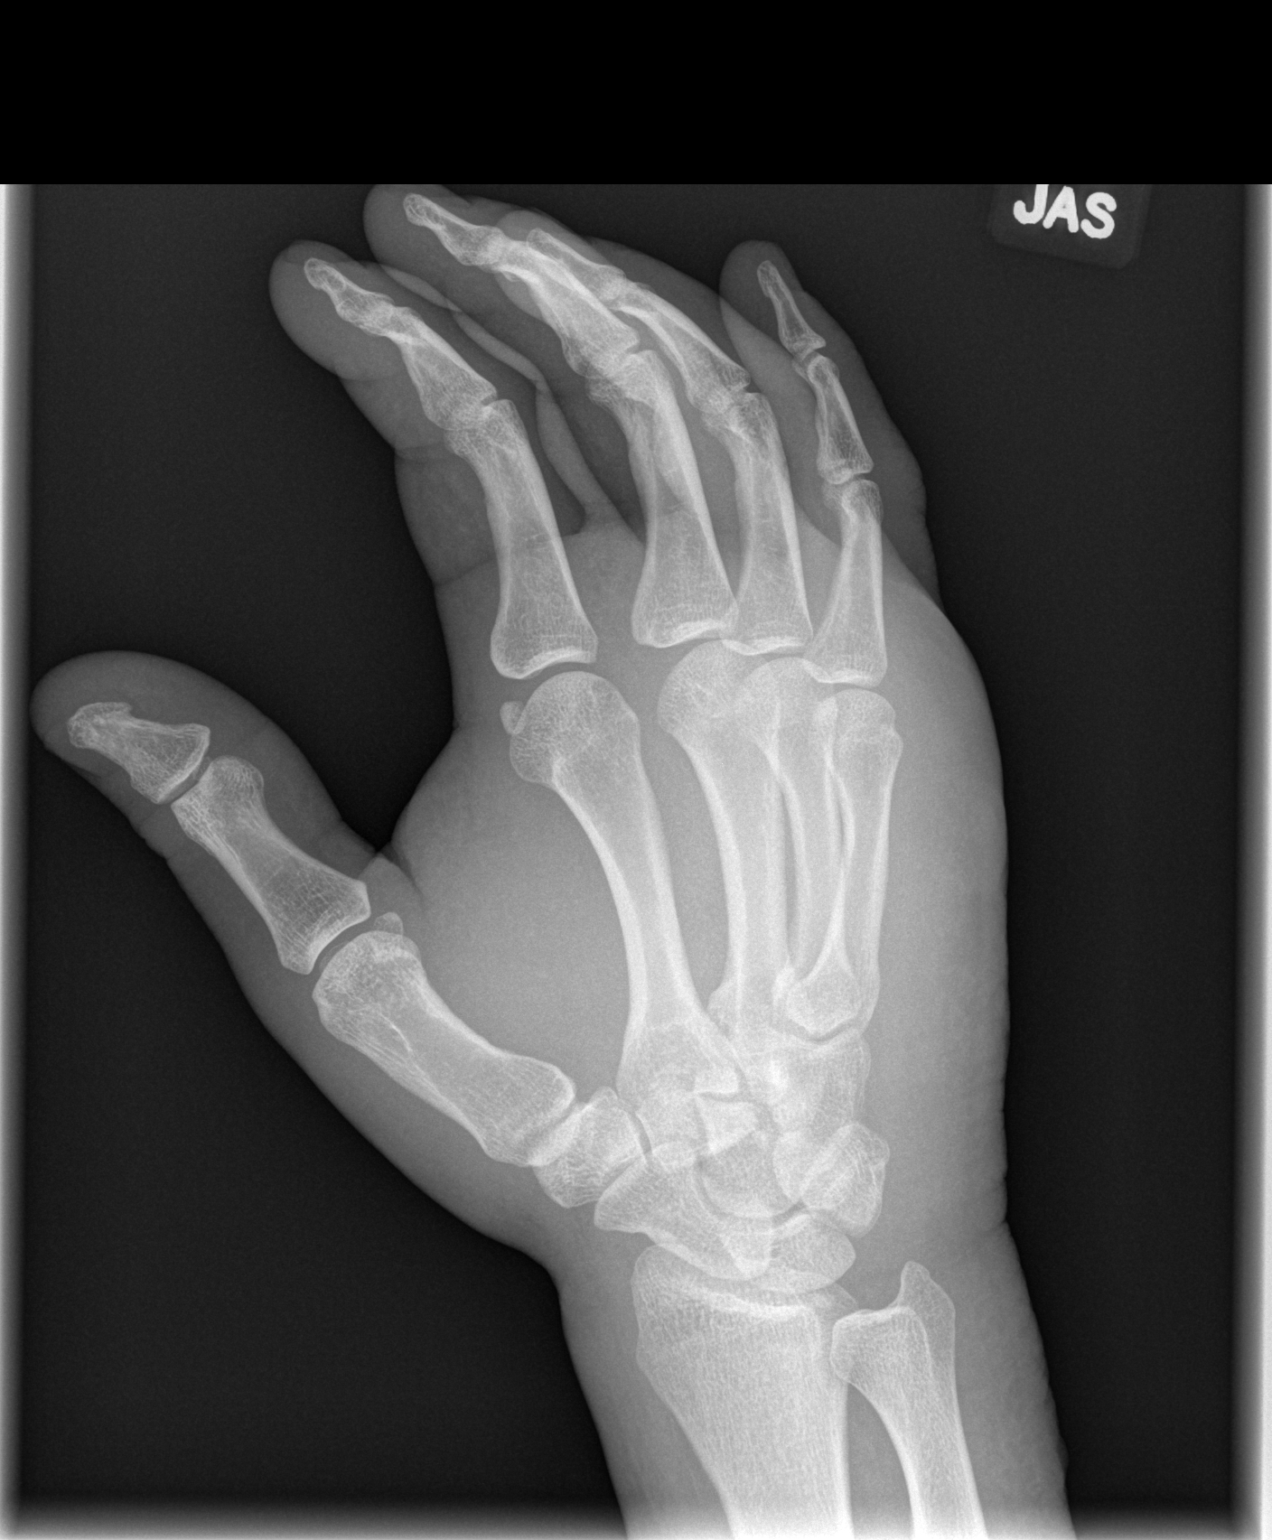

[x hand lat right]
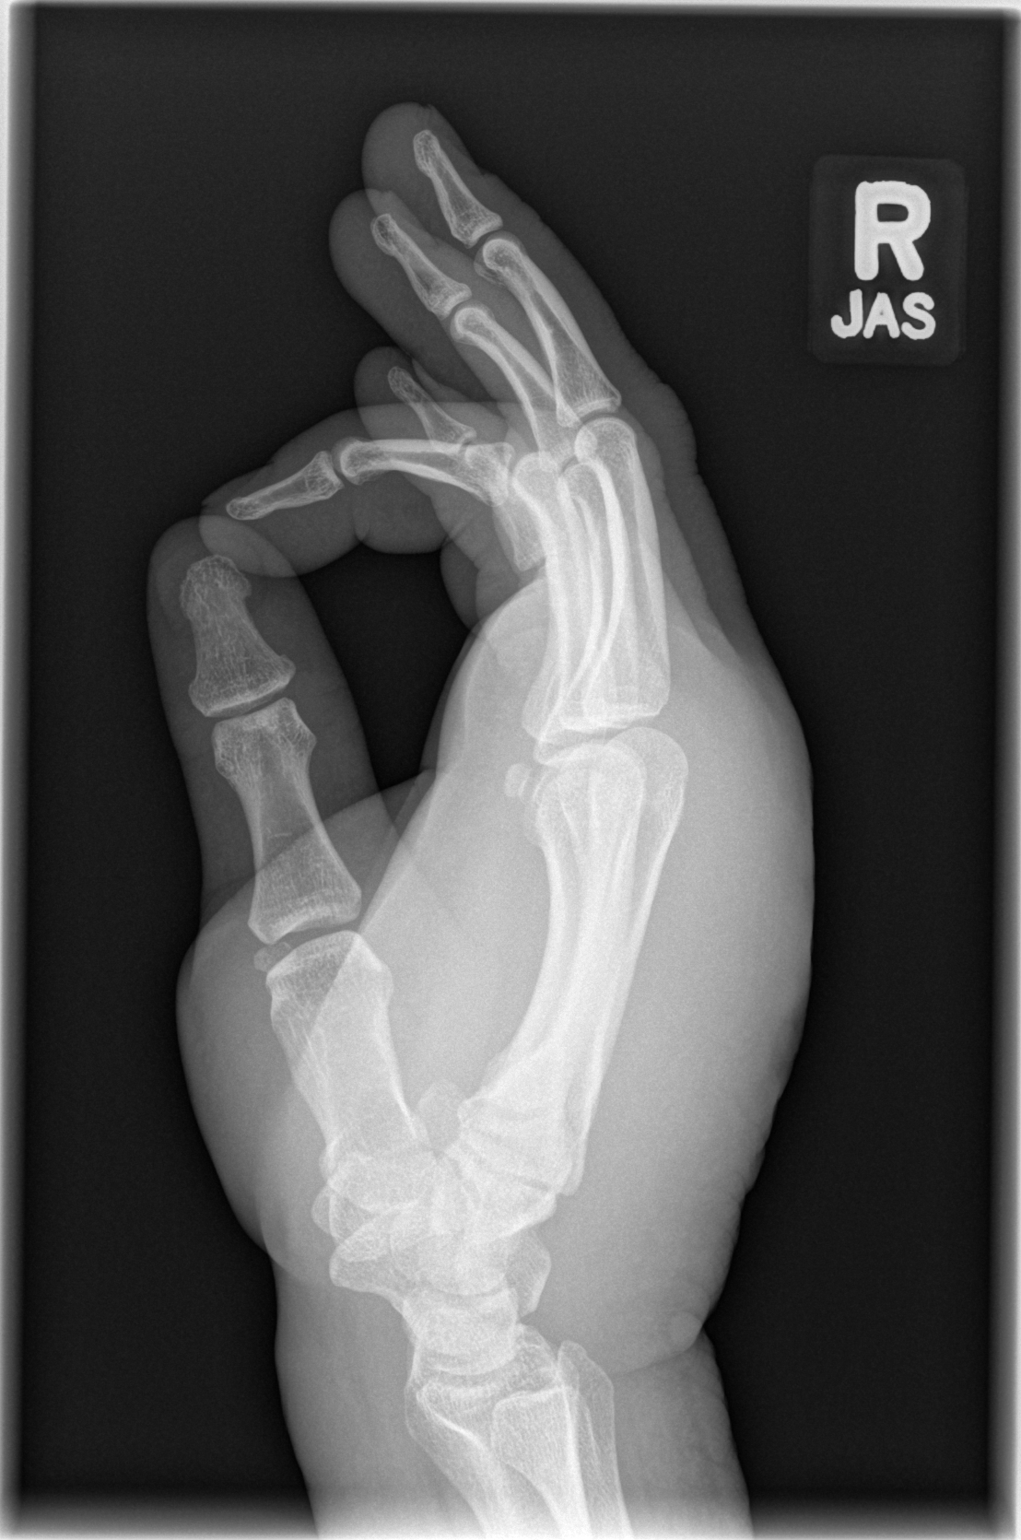

[3 of 3 positions shown; findings below may reference images not displayed]

FINDINGS: Multiple views of the right hand demonstrate no acute
fracture, subluxation, dislocation, retained radiopaque foreign
body or joint abnormality.  There is profound soft tissue swelling
in the right hand, particularly overlying the region of the
metacarpals.
IMPRESSION: 1.  Profound soft tissue swelling of the right hand overlying the
metacarpals.  No unexpected retained radiopaque foreign body or
underlying bony abnormality.

## 2013-08-12 IMAGING — CR DG FOREARM 2V*R*
2 series · 2 of 2 positions shown · non-contrast
Comparison: No priors.

CLINICAL DATA: Pain, redness and swelling of the right hand and
forearm for the past 4 days.

RIGHT FOREARM - 2 VIEW

[x forearm ap right]
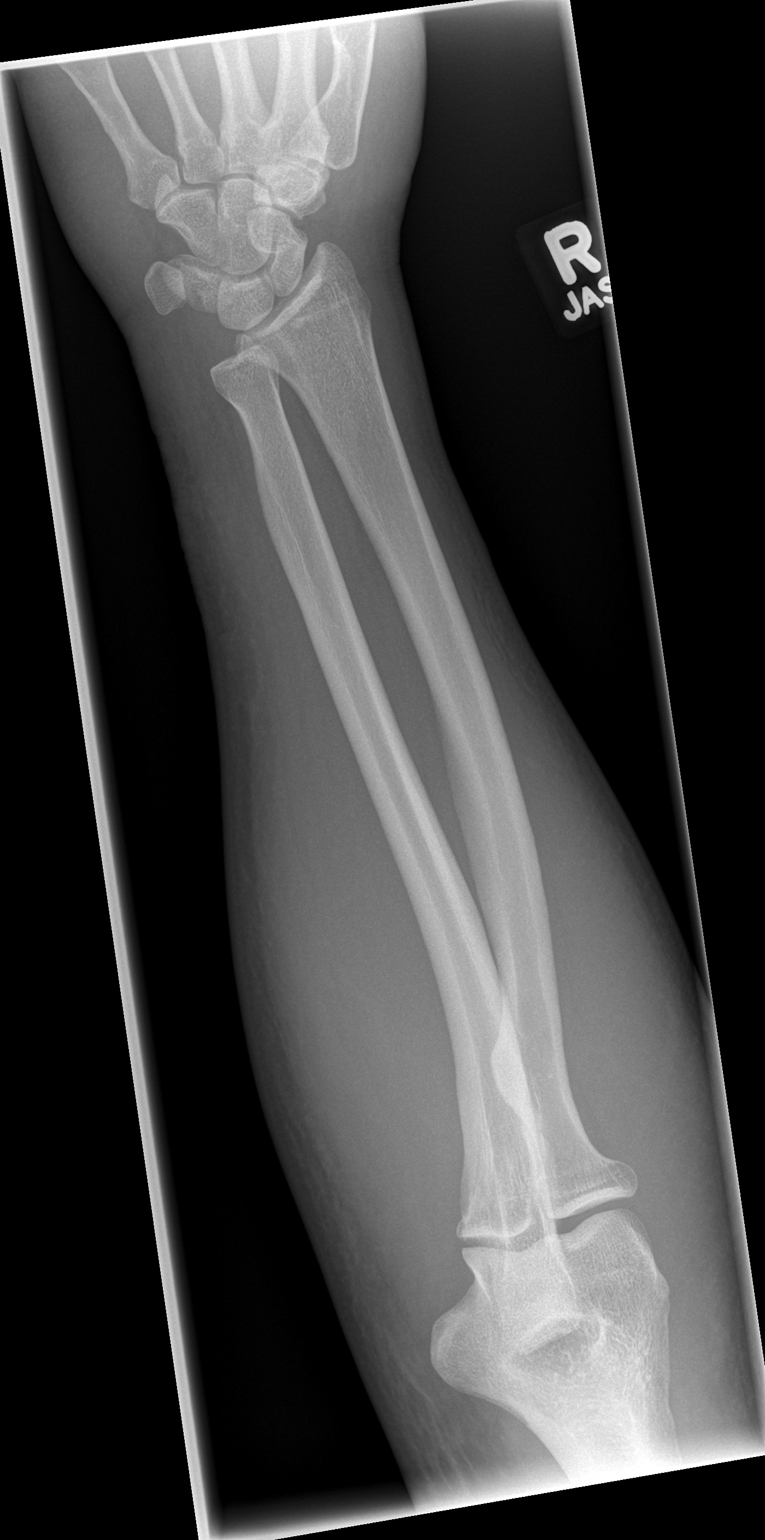

[x forearm lat right]
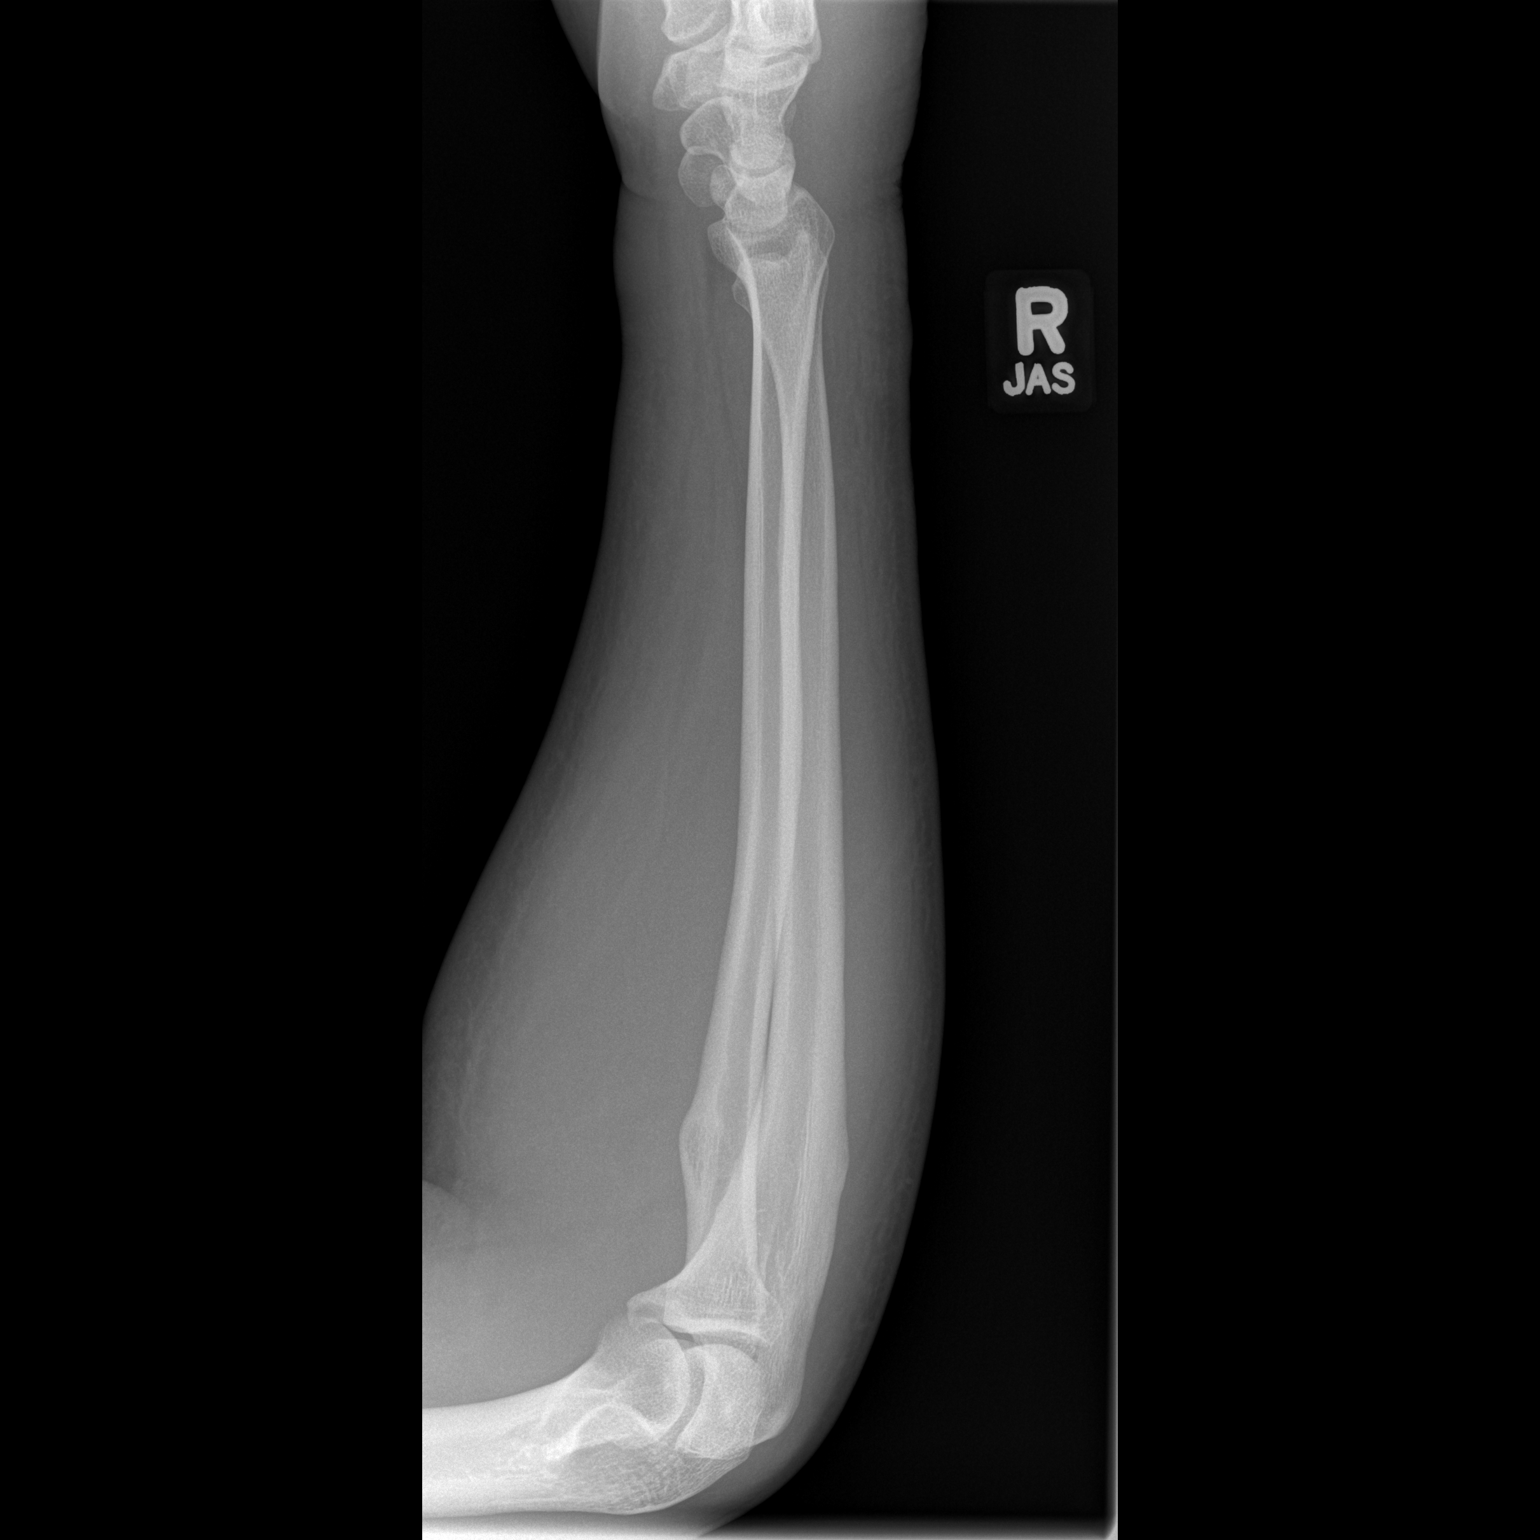

[2 of 2 positions shown; findings below may reference images not displayed]

FINDINGS: AP and lateral radiographs of the right forearm
demonstrate no acute fracture or retained radiopaque foreign body.
Overlying soft tissues appear mildly swollen.
IMPRESSION: 1.  No acute radiographic abnormality of the right forearm.
Specifically, no retained radiopaque foreign body identified.

## 2013-08-18 NOTE — Telephone Encounter (Signed)
error 

## 2013-09-13 ENCOUNTER — Other Ambulatory Visit: Payer: Self-pay

## 2013-09-13 NOTE — Telephone Encounter (Signed)
Dr Conley RollsLe, you saw pt recently, but not sure if you discussed celexa. Wife called for RF stating that pt has ins now but doesn't get pain until end of month. Can we give RFs and how many?

## 2013-09-14 MED ORDER — CITALOPRAM HYDROBROMIDE 20 MG PO TABS: ORAL_TABLET | ORAL | Status: DC

## 2013-09-14 NOTE — Telephone Encounter (Signed)
Sent in Rx and notified wife.

## 2013-09-20 ENCOUNTER — Encounter (HOSPITAL_COMMUNITY): Payer: Self-pay | Admitting: Emergency Medicine

## 2013-09-20 ENCOUNTER — Emergency Department (HOSPITAL_COMMUNITY)
Admission: EM | Admit: 2013-09-20 | Discharge: 2013-09-20 | Disposition: A | Discharge status: Home / Self Care | Payer: BC Managed Care – PPO | Attending: Emergency Medicine | Admitting: Emergency Medicine

## 2013-09-20 DIAGNOSIS — Z79899 Other long term (current) drug therapy: Secondary | ICD-10-CM | POA: Insufficient documentation

## 2013-09-20 DIAGNOSIS — K0889 Other specified disorders of teeth and supporting structures: Secondary | ICD-10-CM

## 2013-09-20 DIAGNOSIS — F411 Generalized anxiety disorder: Secondary | ICD-10-CM | POA: Insufficient documentation

## 2013-09-20 DIAGNOSIS — F17200 Nicotine dependence, unspecified, uncomplicated: Secondary | ICD-10-CM | POA: Insufficient documentation

## 2013-09-20 DIAGNOSIS — K029 Dental caries, unspecified: Secondary | ICD-10-CM | POA: Insufficient documentation

## 2013-09-20 DIAGNOSIS — K089 Disorder of teeth and supporting structures, unspecified: Secondary | ICD-10-CM | POA: Insufficient documentation

## 2013-09-20 DIAGNOSIS — Z8709 Personal history of other diseases of the respiratory system: Secondary | ICD-10-CM | POA: Insufficient documentation

## 2013-09-20 MED ORDER — HYDROCODONE-ACETAMINOPHEN 5-325 MG PO TABS: 1.0000 | ORAL_TABLET | ORAL | Status: AC | PRN

## 2013-09-20 MED ORDER — IBUPROFEN 800 MG PO TABS: 800.0000 mg | ORAL_TABLET | Freq: Three times a day (TID) | ORAL | Status: AC | PRN

## 2013-09-20 MED ORDER — PENICILLIN V POTASSIUM 500 MG PO TABS: 500.0000 mg | ORAL_TABLET | Freq: Four times a day (QID) | ORAL | Status: AC

## 2013-09-20 NOTE — ED Notes (Signed)
Pt states dental pain which is ongoing and worse over weekend.

## 2013-09-20 NOTE — Discharge Instructions (Signed)
Read the information below.  Use the prescribed medication as directed.  Please discuss all new medications with your pharmacist.  Do not take additional tylenol while taking the prescribed pain medication to avoid overdose.  You may return to the Emergency Department at any time for worsening condition or any new symptoms that concern you.  Please call the dentist listed above within 48 hours to schedule a close follow up appointment.  If you develop fevers, swelling in your face, difficulty swallowing or breathing, return to the ER immediately for a recheck.  ° ° °Dental Caries  °Dental caries (also called tooth decay) is the most common oral disease. It can occur at any age, but is more common in children and young adults.  °HOW DENTAL CARIES DEVELOPS  °The process of decay begins when bacteria and foods (particularly sugars and starches) combine in your mouth to produce plaque. Plaque is a substance that sticks to the hard, outer surface of a tooth (enamel). The bacteria in plaque produce acids that attack enamel. These acids may also attack the root surface of a tooth (cementum) if it is exposed. Repeated attacks dissolve these surfaces and create holes in the tooth (cavities). If left untreated, the acids destroy the other layers of the tooth.  °RISK FACTORS °· Frequent sipping of sugary beverages.   °· Frequent snacking on sugary and starchy foods, especially those that easily get stuck in the teeth.   °· Poor oral hygiene.   °· Dry mouth.   °· Substance abuse such as methamphetamine abuse.   °· Broken or poor-fitting dental restorations.   °· Eating disorders.   °· Gastroesophageal reflux disease (GERD).   °· Certain radiation treatments to the head and neck. °SYMPTOMS °In the early stages of dental caries, symptoms are seldom present. Sometimes white, chalky areas may be seen on the enamel or other tooth layers. In later stages, symptoms may include: °· Pits and holes on the enamel. °· Toothache after  sweet, hot, or cold foods or drinks are consumed. °· Pain around the tooth. °· Swelling around the tooth. °DIAGNOSIS  °Most of the time, dental caries is detected during a regular dental checkup. A diagnosis is made after a thorough medical and dental history is taken and the surfaces of your teeth are checked for signs of dental caries. Sometimes special instruments, such as lasers, are used to check for dental caries. Dental X-ray exams may be taken so that areas not visible to the eye (such as between the contact areas of the teeth) can be checked for cavities.  °TREATMENT  °If dental caries is in its early stages, it may be reversed with a fluoride treatment or an application of a remineralizing agent at the dental office. Thorough brushing and flossing at home is needed to aid these treatments. If it is in its later stages, treatment depends on the location and extent of tooth destruction:  °· If a small area of the tooth has been destroyed, the destroyed area will be removed and cavities will be filled with a material such as gold, silver amalgam, or composite resin.   °· If a large area of the tooth has been destroyed, the destroyed area will be removed and a cap (crown) will be fitted over the remaining tooth structure.   °· If the center part of the tooth (pulp) is affected, a procedure called a root canal will be needed before a filling or crown can be placed.   °· If most of the tooth has been destroyed, the tooth may need to be   pulled (extracted). °HOME CARE INSTRUCTIONS °You can prevent, stop, or reverse dental caries at home by practicing good oral hygiene. Good oral hygiene includes: °· Thoroughly cleaning your teeth at least twice a day with a toothbrush and dental floss.   °· Using a fluoride toothpaste. A fluoride mouth rinse may also be used if recommended by your dentist or health care provider.   °· Restricting the amount of sugary and starchy foods and sugary liquids you consume.   °· Avoiding  frequent snacking on these foods and sipping of these liquids.   °· Keeping regular visits with a dentist for checkups and cleanings. °PREVENTION  °· Practice good oral hygiene. °· Consider a dental sealant. A dental sealant is a coating material that is applied by your dentist to the pits and grooves of teeth. The sealant prevents food from being trapped in them. It may protect the teeth for several years. °· Ask about fluoride supplements if you live in a community without fluorinated water or with water that has a low fluoride content. Use fluoride supplements as directed by your dentist or health care provider. °· Allow fluoride varnish applications to teeth if directed by your dentist or health care provider. °Document Released: 05/11/2002 Document Revised: 04/21/2013 Document Reviewed: 08/21/2012 °ExitCare® Patient Information ©2014 [employer]. ° °Dental Pain °A tooth ache may be caused by cavities (tooth decay). Cavities expose the nerve of the tooth to air and hot or cold temperatures. It may come from an infection or abscess (also called a boil or furuncle) around your tooth. It is also often caused by dental caries (tooth decay). This causes the pain you are having. °DIAGNOSIS  °Your caregiver can diagnose this problem by exam. °TREATMENT  °· If caused by an infection, it may be treated with medications which kill germs (antibiotics) and pain medications as prescribed by your caregiver. Take medications as directed. °· Only take over-the-counter or prescription medicines for pain, discomfort, or fever as directed by your caregiver. °· Whether the tooth ache today is caused by infection or dental disease, you should see your dentist as soon as possible for further care. °SEEK MEDICAL CARE IF: °The exam and treatment you received today has been provided on an emergency basis only. This is not a substitute for complete medical or dental care. If your problem worsens or new problems (symptoms) appear, and  you are unable to meet with your dentist, call or return to this location. °SEEK IMMEDIATE MEDICAL CARE IF:  °· You have a fever. °· You develop redness and swelling of your face, jaw, or neck. °· You are unable to open your mouth. °· You have severe pain uncontrolled by pain medicine. °MAKE SURE YOU:  °· Understand these instructions. °· Will watch your condition. °· Will get help right away if you are not doing well or get worse. °Document Released: 08/19/2005 Document Revised: 11/11/2011 Document Reviewed: 04/06/2008 °ExitCare® Patient Information ©2014 [employer]. ° ° °Emergency Department Resource Guide °1) Find a Doctor and Pay Out of Pocket °Although you won't have to find out who is covered by your insurance plan, it is a good idea to ask around and get recommendations. You will then need to call the office and see if the doctor you have chosen will accept you as a new patient and what types of options they offer for patients who are self-pay. Some doctors offer discounts or will set up payment plans for their patients who do not have insurance, but you will need to ask so   you aren't surprised when you get to your appointment. ° °2) Contact Your Local Health Department °Not all health departments have doctors that can see patients for sick visits, but many do, so it is worth a call to see if yours does. If you don't know where your local health department is, you can check in your phone book. The CDC also has a tool to help you locate your state's health department, and many state websites also have listings of all of their local health departments. ° °3) Find a Walk-in Clinic °If your illness is not likely to be very severe or complicated, you may want to try a walk in clinic. These are popping up all over the country in pharmacies, drugstores, and shopping centers. They're usually staffed by nurse practitioners or physician assistants that have been trained to treat common illnesses and complaints.  They're usually fairly quick and inexpensive. However, if you have serious medical issues or chronic medical problems, these are probably not your best option. ° °No Primary Care Doctor: °- Call [facility] at  832-8000 - they can help you locate a primary care doctor that  accepts your insurance, provides certain services, etc. °- Physician Referral Service- 1-800-533-3463 ° °Chronic Pain Problems: °Organization         Address  Phone   Notes  ° [facility]  (336) 297-2271 Patients need to be referred by their primary care doctor.  ° °Medication Assistance: °Organization         Address  Phone   Notes  °[place] [facility] [address] °[place], [place] [address] (336) 641-8030 --Must be a resident of [place] °-- Must have NO insurance coverage whatsoever (no Medicaid/ Medicare, etc.) °-- The pt. MUST have a primary care doctor that directs their care regularly and follows them in the community °  °[facility]  (866) 331-1348   °[employer]  (888) 892-1162   ° °Agencies that provide inexpensive medical care: °Organization         Address  Phone   Notes  °[facility]  (336) 832-8035   °[facility]    (336) 832-7272   °[facility] [address] °[place], [place] [address] (336) 832-4777   °[facility] [address], °[place] (336) 271-4999   °[facility]    (336) 373-0678   °[facility]    (336) 272-1050   °[facility] ° [address], [place] Phone:  (336) 832-4444, Fax:  (336) 832-4440 Hours of Operation:  9 am - 6 pm, M-F.  Also accepts Medicaid/Medicare and self-pay.  °[facility] ° [address], [place] Phone: (336) 832-3150, Fax: (336) 832-3151. Hours of Operation:  8:30 am - 5:30 pm, M-F.  Also accepts Medicaid and self-pay.  °[facility] [place] [address], [place]  Phone: (336) 878-6027   °[facility] [address], [place],  (336)723-1848, Ext. 123 Mondays & Thursdays: 7-9 AM.  First 15 patients are seen on a first come, first serve basis. °  ° °Medicaid-accepting [place] Providers: ° °Organization         Address  Phone   Notes  °[facility] [address], [place] (336) 641-2100 Also accepts self-pay patients.  °[facility] [address], [place] ° (336) 856-9996   °  [facility] [address], [place] (336) 288-8857   °[facility] [address] [place] [address], [place] (336) 299-7000   °Veita Bland [address], [place]  ° (336) 373-1557 Only accepts [place] Access Medicaid patients after they have their name applied to their card.  ° °Self-Pay (no insurance) in [place]: ° °Organization         Address  Phone   Notes  °Sickle Cell Patients, [facility] [address], [place] (336) 832-1970   °[facility] [address], [place] (336) 832-4400   °[facility] [place] [facility] ° [address], [place] (336) 992-4800   °[facility]/Dr. Osei-Bonsu ° [address] [place] [address], [place] or [address], [place] (336) 841-8500 Phone number for both [place] and [place] locations is the same.  °[facility] [address], [place] (336) 299-0000   °[facility] [address] [place] [address], [place] or [address] (336) 852-7530 °(336) 878-2260   °[facility] [address], [place] (336) 350-1642, phone; (336) 294-5005, fax Sees patients 1st and 3rd Saturday of every month.  Must not qualify for public or private insurance (i.e. Medicaid, Medicare, [place] Health Choice, Veterans' Benefits) • Household income should be no more than 200% of the poverty level •The clinic cannot  treat you if you are pregnant or think you are pregnant • Sexually transmitted diseases are not treated at the clinic.  ° ° °Dental Care: °Organization         Address  Phone  Notes  °[facility] [address], [place] (336) 641-6152 Accepts children up to age 21 who are enrolled in Medicaid or [place] Health Choice; pregnant women with a Medicaid card; and children who have applied for Medicaid or [place] Health Choice, but were declined, whose parents can pay a reduced fee at time of service.  °[facility] [place]  [address], [place] (336) 641-7733 Accepts children up to age 21 who are enrolled in Medicaid or [place] Health Choice; pregnant women with a Medicaid card; and children who have applied for Medicaid or [place] Health Choice, but were declined, whose parents can pay a reduced fee at time of service.  °[facility] ° [address],  (336) 641-4533 Patients are seen by appointment only. Walk-ins are not accepted. [facility] will see patients 18 years of age and older. °Monday - Tuesday (8am-5pm) °Most Wednesdays (8:30-5pm) °$30 per visit, cash only  °[facility] ° [address], [place] (336) 641-4533 Patients are seen by appointment only. Walk-ins are not accepted. [facility] will see patients 18 years of age and older. °One Wednesday Evening (Monthly: Volunteer Based).  $30 per visit, cash only  °[facility]  (919) 537-3737 for adults; Children under age 4, call Graduate Pediatric Dentistry at (919) [phone]. Children aged 4-14, please call (919) [phone] to request a pediatric application. ° Dental services are provided in all areas of dental care including fillings, crowns and bridges, complete and partial dentures, implants, gum treatment, root canals, and extractions. Preventive care is also provided.  Treatment is provided to both adults and children. °Patients are selected via a lottery and there is often a waiting list. °  °  [facility] [address], °[place] ° (336) 763-8833 [email or web address] °  °[facility] [address], [place], [place] (336)723-1848, Ext. 123 Second and Fourth Thursday of each month, opens at 6:30 AM; Clinic ends at 9 AM.  Patients are seen on a first-come first-served basis, and a limited number are seen during each clinic.  ° °[facility] ° [address], [place], [place] (336) 723-7904   Eligibility Requirements °You must have lived in [place], [place], or [place] counties for at least the last three months. °  You cannot be eligible for state or federal sponsored healthcare insurance, including Veterans Administration, Medicaid, or Medicare. °  You generally cannot be eligible for healthcare insurance through your employer.  °  How to apply: °Eligibility screenings are held every Tuesday and Wednesday afternoon from 1:00 pm until 4:00 pm. You do not need an appointment for the interview!  °[facility] [address], [place], [place] 336-631-2330   °[facility] [facility]  336-342-8273   °[facility]  336-703-3100   °[facility]  336-570-6415   ° °Behavioral Health Resources in the Community: °Intensive Outpatient Programs °Organization         Address  Phone  Notes  °[facility] [address], [place], [place] 336-878-6098   °[facility] [address], [place], [place] 336-832-9800   °[facility] [address], [place], [place] ° 336-882-2125   °[facility] [address],  °[place], [place] 1-800-853-5163 or 336-641-4981   °Substance Abuse Resources °Organization         Address  Phone  Notes  °[facility]  336-882-2125   °[facility]   336-784-9470   °[facility]  336-285-9073   °[facility]  336-845-3988   °Residential & Outpatient Substance Abuse Program  1-800-659-3381   °Psychological Services °Organization         Address  Phone  Notes  °[facility]  336- 832-9600   °[facility]  336- 378-7881   °[facility] [address], [place] 1-800-853-5163 or 336-641-4981   ° °Mobile Crisis Teams °Organization         Address  Phone  Notes  °[facility], Mobile Crisis Care Unit  1-877-626-1772   °Assertive °[facility] ° [address] [place], [place] 336-834-9664   °Sharon DeEsch [address] °[place] 336-554-5454   ° °Self-Help/Support Groups °Organization         Address  Phone             Notes  °[facility] - variety of support groups  336- 373-1402 Call for more information  °Narcotics Anonymous (NA), [facility] [address], °[place]  2 meetings at this location  ° °Residential Treatment Programs °Organization         Address  Phone  Notes  °[facility] [address],    °[place]  1-866-801-8205   °[facility] ° [address], [place], [place] 704-293-8524   °[facility] [address], [place] 336-845-3988 Admissions: 8am-3pm M-F  °[facility] [address]    °[place], [place] 336-841-1104   °[facility] [address], [place], [place] 336-379-7146   °[facility] [address]  °  [place], [place] 336-285-9073   °[facility] Intensive Outpatient [address], [place], [place] 336-852-3033   °[facility] [address]  °[place], [place] 1-877-615-2722 or 336-784-9470   °[facility] [address] [place], [place] 336-227-7417 Accepts Medicaid  °[facility] [address]  °[place] 1-800-659-3381 Substance  Abuse/Addiction Treatment  ° °[place] Behavioral Health Resources °Organization         Address  Phone  Notes  °[facility]  (888) 581-9988   °Julie Brannon, PhD [address] [place], [place]   (336) 349-5553 or (336) 951-0000   °[facility]   [address] °[place], [place] (336) 349-4454   °[facility] [address], [place], [place] (336) 342-8316 Insurance/Medicaid/sponsorship through [facility]  °[facility] [address]                                    [place], [place] (336) 342-8316 Therapy/tele-psych/case  °[facility] [address]  ° [place], [place] (336) 349-2233    °Dr. Arfeen  (336) 349-4544   °[facility] [place]  [employer] [place] [facility] 1) [address], [place] °2) [address], [place] °3)  [address], [place] (336) 349-3220 °(336) 342-7768 ° °(336) 342-8140   °[place] [facility] (336) 342-1394 or (336) 342-3537 (After Hours)    ° ° ° ° °

## 2013-09-20 NOTE — ED Provider Notes (Signed)
CSN: [id number]     Arrival date & time 09/20/13  1226 History  This chart was scribed for non-physician practitioner, Trixie DredgeEmily Quintyn Dombek, PA-C working with Ethelda ChickMartha K Linker, MD by Greggory StallionKayla Andersen, ED scribe. This patient was seen in room WTR9/WTR9 and the patient's care was started at 2:17 PM.    Chief Complaint  Patient presents with  . Dental Pain   The history is provided by the patient. No language interpreter was used.   HPI Comments: Hector Parks is a 38 y.o. male who presents to the Emergency Department complaining of gradual onset, worsening, left sided dental pain that started 3 days ago. Pt states one of his teeth fell out yesterday. He states he is also having a resolving sore throat. Pt had a fever of 100.5 yesterday but it has resolved now. He states he is having dental work done in March. Pt has taken tylenol with little relief. Denies chills, trouble swallowing, trouble breathing.   Past Medical History  Diagnosis Date  . Spontaneous pneumothorax   . Anxiety    Past Surgical History  Procedure Laterality Date  . I&d extremity  12/23/2011    Procedure: IRRIGATION AND DEBRIDEMENT EXTREMITY;  Surgeon: Marlowe ShoresMatthew A Weingold, MD;  Location: [facility] OR;  Service: Orthopedics;  Laterality: Right;  . I&d extremity  12/25/2011    Procedure: IRRIGATION AND DEBRIDEMENT EXTREMITY;  Surgeon: Marlowe ShoresMatthew A Weingold, MD;  Location: [facility] OR;  Service: Orthopedics;  Laterality: Right;  REPEAT I & D RIGHT HAND   . I&d extremity  12/26/2011    Procedure: IRRIGATION AND DEBRIDEMENT EXTREMITY;  Surgeon: Tami RibasKevin R Kuzma, MD;  [facility];  Service: Orthopedics;  Laterality: Right;  . Spontaneous pneumothorax  2000  . Strep infection surgery     History reviewed. No pertinent family history. History  Substance Use Topics  . Smoking status: Current Every Day Smoker -- 0.50 packs/day for 5 years    Types: Cigarettes  . Smokeless tobacco: Not on file  . Alcohol Use: No    Review of Systems  Constitutional:  Negative for fever and chills.  HENT: Positive for dental problem and sore throat. Negative for trouble swallowing.   All other systems reviewed and are negative.    Allergies  Review of patient's allergies indicates no known allergies.  Home Medications   Current Outpatient Rx  Name  Route  Sig  Dispense  Refill  . acetaminophen (TYLENOL) 500 MG tablet   Oral   Take 1,000 mg by mouth every 6 (six) hours as needed for headache.         . citalopram (CELEXA) 20 MG tablet   Oral   Take 30 mg by mouth daily.          BP 136/93  Pulse 84  Temp(Src) 98.4 F (36.9 C) (Oral)  Resp 16  SpO2 100%  Physical Exam  Nursing note and vitals reviewed. Constitutional: He appears well-developed and well-nourished. No distress.  HENT:  Head: Normocephalic and atraumatic.  Mouth/Throat: Uvula is midline, oropharynx is clear and moist and mucous membranes are normal.  Severe dental decay on right lower side on molar and premolar. Missing right upper premolar with associated gingival tenderness.   Neck: Neck supple.  No paratracheal tenderness.   Pulmonary/Chest: Effort normal.  Lymphadenopathy:    He has no cervical adenopathy.  Neurological: He is alert.  Skin: He is not diaphoretic.    ED Course  Procedures (including critical care time)  DIAGNOSTIC STUDIES: Oxygen Saturation  is 100% on RA, normal by my interpretation.    COORDINATION OF CARE: 2:20 PM-Discussed treatment plan which includes an antibiotic with pt at bedside and pt agreed to plan.   Labs Review Labs Reviewed - No data to display Imaging Review No results found.  EKG Interpretation   None       MDM   1. Dental decay   2. Pain, dental    Afebrile nontoxic no meningeal signs, well hydrated - dental pain but no apparent abscess.  D/C home with dental follow up, ibuprofen, norco, penicillin.  Discussed  findings, treatment, and follow up  with patient.  Pt given return precautions.  Pt verbalizes  understanding and agrees with plan.     I doubt any other EMC precluding discharge at this time including, but not necessarily limited to the following: ludwig's angina, deep space head or neck infection.   I personally performed the services described in this documentation, which was scribed in my presence. The recorded information has been reviewed and is accurate.   Trixie Dredge, PA-C 09/20/13 [phone]

## 2013-09-21 NOTE — ED Provider Notes (Signed)
Medical screening examination/treatment/procedure(s) were performed by non-physician practitioner and as supervising physician I was immediately available for consultation/collaboration.  EKG Interpretation   None        Martha K Linker, MD 09/21/13 1722 

## 2013-12-11 ENCOUNTER — Other Ambulatory Visit: Payer: Self-pay | Admitting: Family Medicine

## 2015-06-06 ENCOUNTER — Encounter: Payer: Self-pay | Admitting: Emergency Medicine
